# Patient Record
Sex: Female | Born: 1990 | Race: White | Hispanic: No | Marital: Single | Smoking: Former smoker
Health system: Southern US, Community
[De-identification: ages and names within clinical notes are randomized; demographics above are authoritative.]

## PROBLEM LIST (undated history)

## (undated) DIAGNOSIS — A749 Chlamydial infection, unspecified: Secondary | ICD-10-CM

## (undated) DIAGNOSIS — B977 Papillomavirus as the cause of diseases classified elsewhere: Secondary | ICD-10-CM

## (undated) DIAGNOSIS — C801 Malignant (primary) neoplasm, unspecified: Secondary | ICD-10-CM

## (undated) DIAGNOSIS — F419 Anxiety disorder, unspecified: Secondary | ICD-10-CM

## (undated) DIAGNOSIS — Z789 Other specified health status: Secondary | ICD-10-CM

## (undated) DIAGNOSIS — IMO0002 Reserved for concepts with insufficient information to code with codable children: Secondary | ICD-10-CM

## (undated) DIAGNOSIS — R87619 Unspecified abnormal cytological findings in specimens from cervix uteri: Secondary | ICD-10-CM

## (undated) DIAGNOSIS — A549 Gonococcal infection, unspecified: Secondary | ICD-10-CM

## (undated) HISTORY — DX: Papillomavirus as the cause of diseases classified elsewhere: B97.7

## (undated) HISTORY — PX: NO PAST SURGERIES: SHX2092

---
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
## 2011-01-06 ENCOUNTER — Inpatient Hospital Stay (HOSPITAL_COMMUNITY)
Admission: AD | Admit: 2011-01-06 | Discharge: 2011-01-06 | Disposition: A | Discharge status: Home / Self Care | Payer: Self-pay | Source: Ambulatory Visit | Attending: Obstetrics & Gynecology | Admitting: Obstetrics & Gynecology

## 2011-01-06 ENCOUNTER — Inpatient Hospital Stay (HOSPITAL_COMMUNITY): Payer: Self-pay

## 2011-01-06 DIAGNOSIS — R109 Unspecified abdominal pain: Secondary | ICD-10-CM

## 2011-01-06 DIAGNOSIS — O2390 Unspecified genitourinary tract infection in pregnancy, unspecified trimester: Secondary | ICD-10-CM | POA: Insufficient documentation

## 2011-01-06 DIAGNOSIS — N390 Urinary tract infection, site not specified: Secondary | ICD-10-CM | POA: Insufficient documentation

## 2011-01-06 LAB — URINALYSIS, ROUTINE W REFLEX MICROSCOPIC
Bilirubin Urine: NEGATIVE
Glucose, UA: NEGATIVE mg/dL
Hgb urine dipstick: NEGATIVE
Hgb urine dipstick: NEGATIVE
Ketones, ur: NEGATIVE mg/dL
Ketones, ur: NEGATIVE mg/dL
Nitrite: NEGATIVE
Nitrite: NEGATIVE
Protein, ur: NEGATIVE mg/dL
Protein, ur: NEGATIVE mg/dL
Specific Gravity, Urine: 1.015 (ref 1.005–1.030)
Urobilinogen, UA: 0.2 mg/dL (ref 0.0–1.0)
Urobilinogen, UA: 1 mg/dL (ref 0.0–1.0)
pH: 6.5 (ref 5.0–8.0)
pH: 6 (ref 5.0–8.0)

## 2011-01-06 LAB — URINE MICROSCOPIC-ADD ON

## 2011-01-06 LAB — WET PREP, GENITAL
Trich, Wet Prep: NONE SEEN
Yeast Wet Prep HPF POC: NONE SEEN

## 2011-01-06 LAB — DIFFERENTIAL
Basophils Absolute: 0 10*3/uL (ref 0.0–0.1)
Basophils Relative: 0 % (ref 0–1)
Eosinophils Absolute: 0.1 10*3/uL (ref 0.0–0.7)
Monocytes Absolute: 0.6 10*3/uL (ref 0.1–1.0)
Monocytes Relative: 6 % (ref 3–12)

## 2011-01-06 LAB — CBC
MCH: 31 pg (ref 26.0–34.0)
MCHC: 34.5 g/dL (ref 30.0–36.0)
Platelets: 263 10*3/uL (ref 150–400)
RDW: 12.2 % (ref 11.5–15.5)

## 2011-01-06 LAB — POCT PREGNANCY, URINE: Preg Test, Ur: POSITIVE

## 2011-01-07 LAB — URINE CULTURE

## 2011-03-16 IMAGING — US US OB COMP LESS 14 WK
1 series · 14 of 28 positions shown · non-contrast
Comparison: None.

CLINICAL DATA: Abdominal pain.

OBSTETRIC <14 WK US AND TRANSVAGINAL OB US
TECHNIQUE: Both transabdominal and transvaginal ultrasound
examinations were performed for complete evaluation of the
gestation as well as the maternal uterus, adnexal regions, and
pelvic cul-de-sac.  Transvaginal technique was performed to assess
early pregnancy.

[Series 1: us ob comp less 14 wks · 14 of 29 slices shown]
[im 2/29]
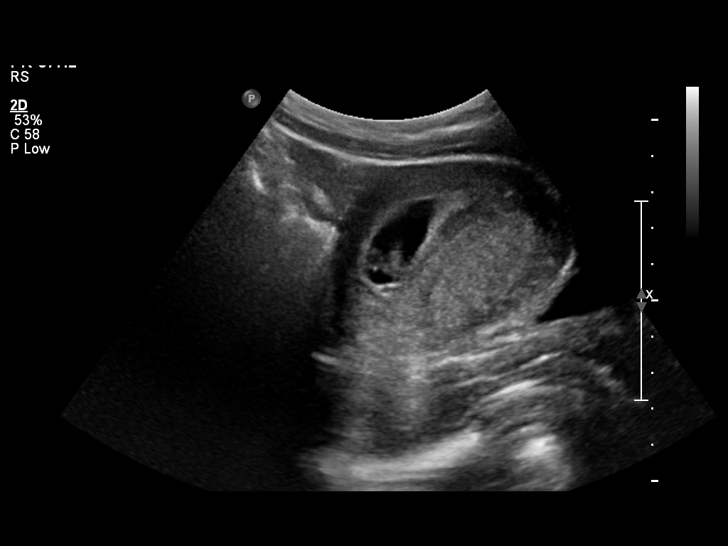
[im 4/29]
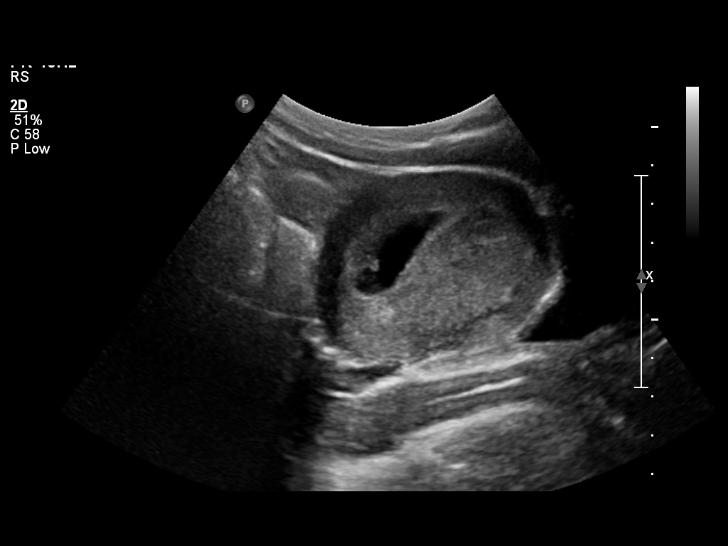
[im 6/29]
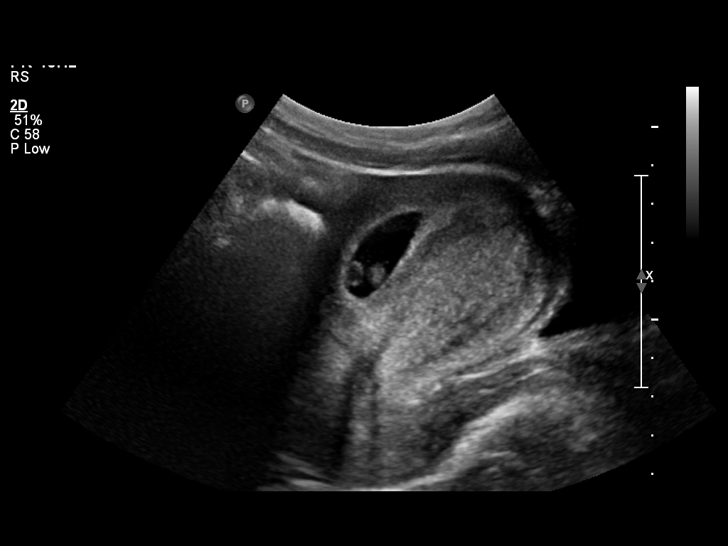
[im 8/29]
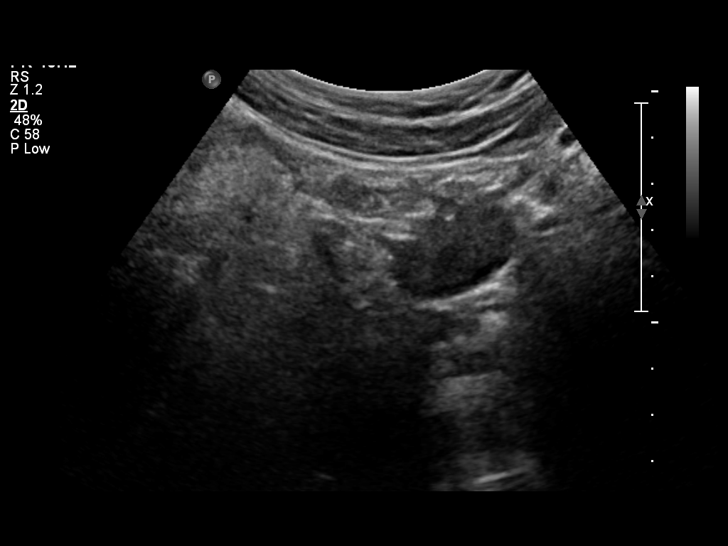
[im 10/29]
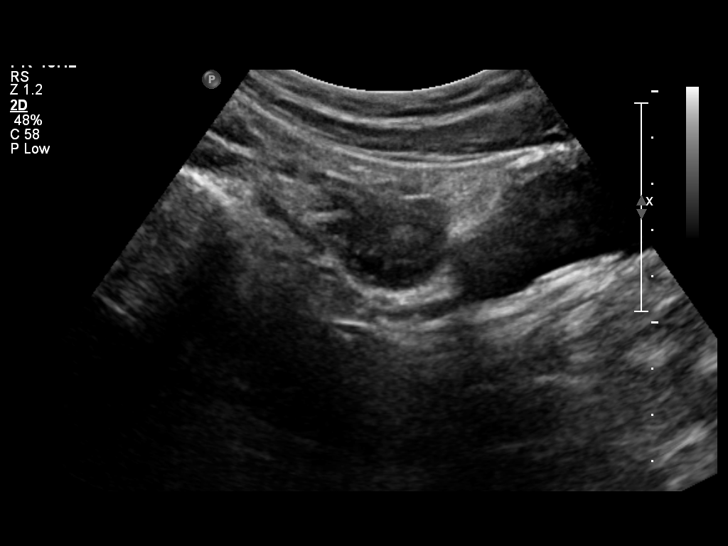
[im 12/29]
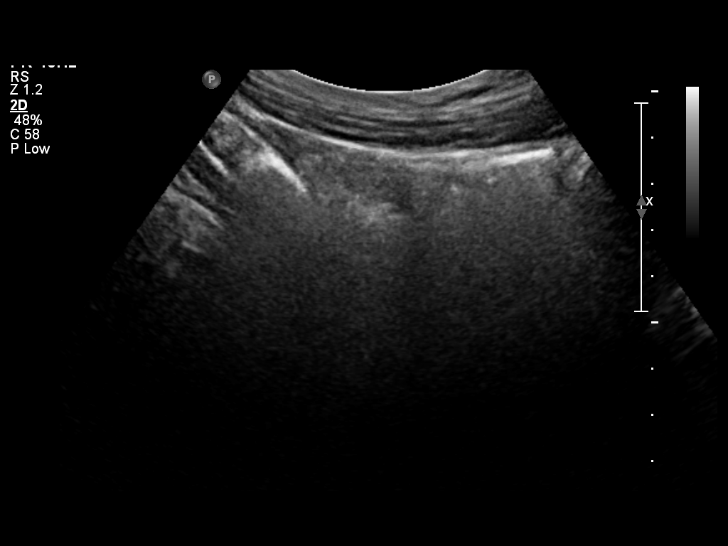
[im 14/29]
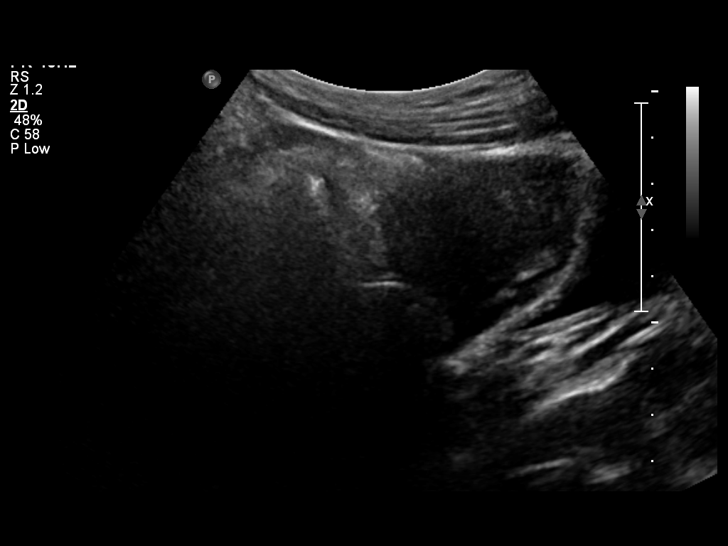
[im 16/29]
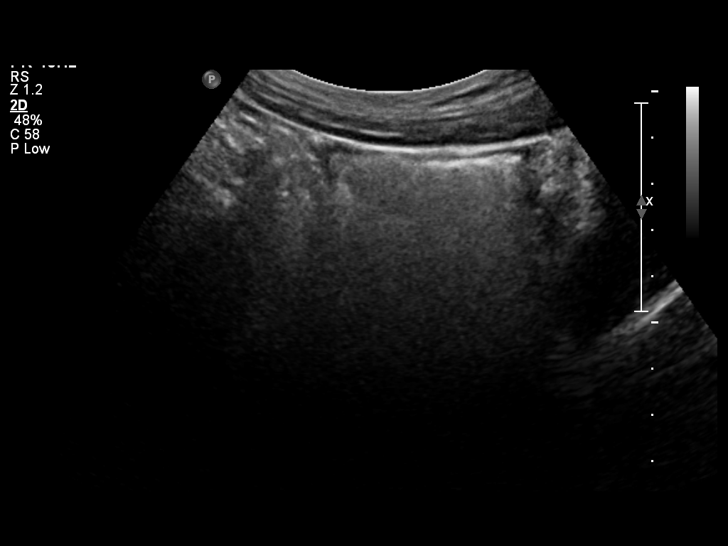
[im 18/29]
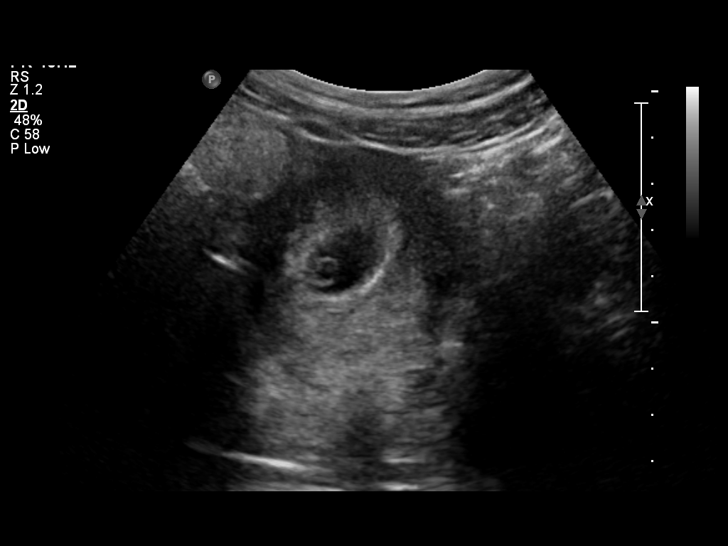
[im 20/29]
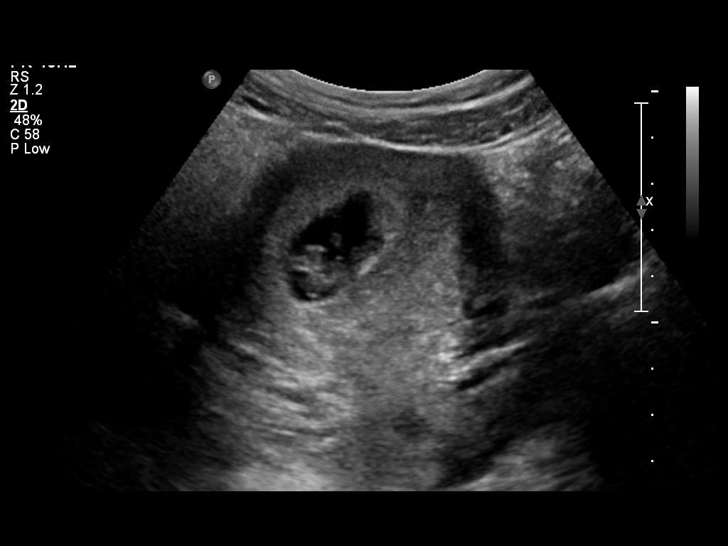
[im 22/29]
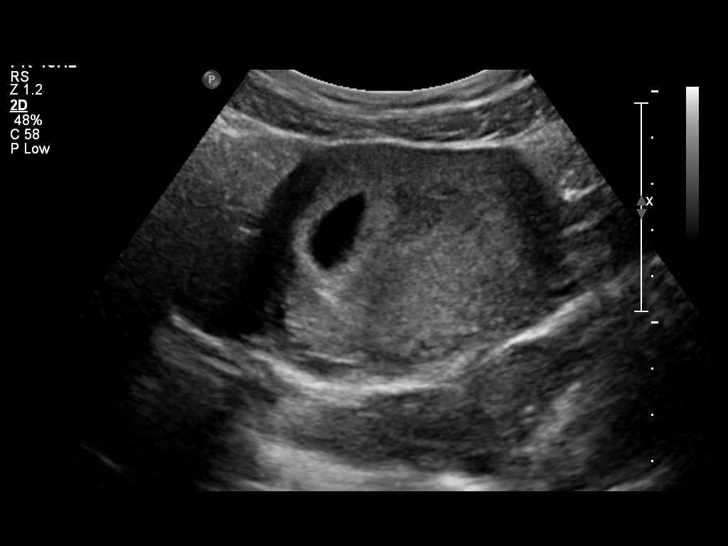
[im 24/29]
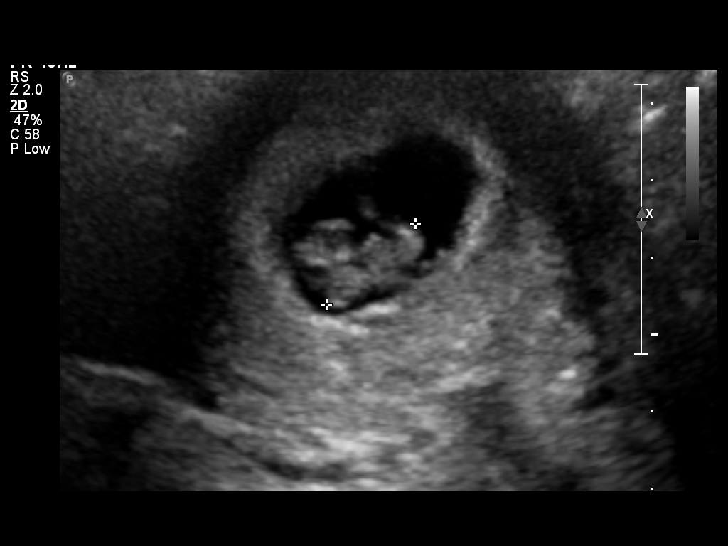
[im 26/29]
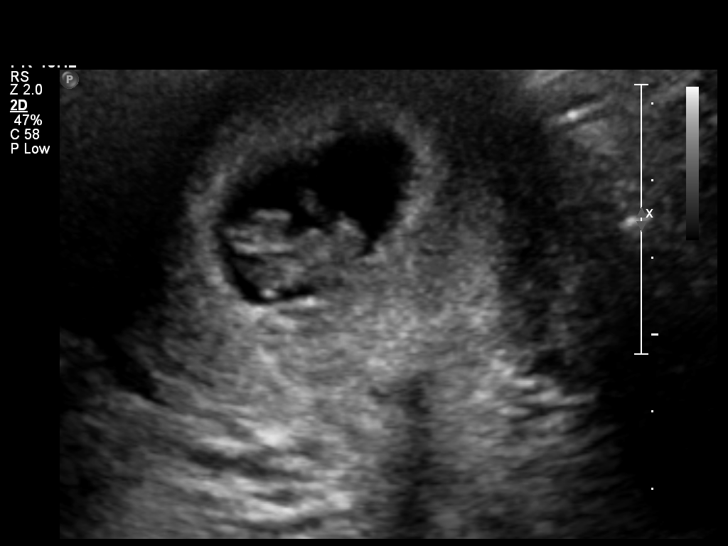
[im 29/29]
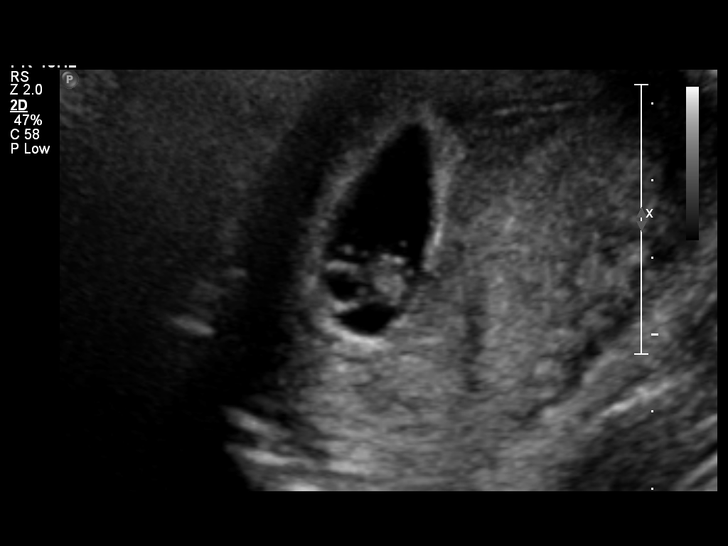

[14 of 28 positions shown; findings below may reference images not displayed]

Intrauterine gestational sac:  Visualized/normal in shape.
Yolk sac: Yes
Embryo: Yes
Cardiac Activity: Yes

CRL: 15.7   mm  8   w  0   d        US EDC: 08/18/2011

Maternal uterus/adnexae:
There is no subacute chorionic hemorrhage.  The left ovary is
normal.  The right ovary is not visible.  No free fluid.
IMPRESSION: Normal appearing single intrauterine pregnancy of 8 weeks 0 days
gestation.

## 2011-03-31 ENCOUNTER — Inpatient Hospital Stay (HOSPITAL_COMMUNITY): Payer: Self-pay

## 2011-03-31 ENCOUNTER — Inpatient Hospital Stay (HOSPITAL_COMMUNITY)
Admission: AD | Admit: 2011-03-31 | Discharge: 2011-03-31 | Disposition: A | Discharge status: Home / Self Care | Payer: Self-pay | Source: Ambulatory Visit | Attending: Obstetrics and Gynecology | Admitting: Obstetrics and Gynecology

## 2011-03-31 DIAGNOSIS — O9989 Other specified diseases and conditions complicating pregnancy, childbirth and the puerperium: Secondary | ICD-10-CM

## 2011-03-31 DIAGNOSIS — R109 Unspecified abdominal pain: Secondary | ICD-10-CM

## 2011-03-31 DIAGNOSIS — O99891 Other specified diseases and conditions complicating pregnancy: Secondary | ICD-10-CM | POA: Insufficient documentation

## 2011-04-11 ENCOUNTER — Inpatient Hospital Stay (HOSPITAL_COMMUNITY): Payer: Self-pay

## 2011-04-11 ENCOUNTER — Inpatient Hospital Stay (HOSPITAL_COMMUNITY)
Admission: AD | Admit: 2011-04-11 | Discharge: 2011-04-11 | Disposition: A | Discharge status: Home / Self Care | Payer: Self-pay | Source: Ambulatory Visit | Attending: Obstetrics and Gynecology | Admitting: Obstetrics and Gynecology

## 2011-04-11 DIAGNOSIS — N390 Urinary tract infection, site not specified: Secondary | ICD-10-CM | POA: Insufficient documentation

## 2011-04-11 DIAGNOSIS — R109 Unspecified abdominal pain: Secondary | ICD-10-CM | POA: Insufficient documentation

## 2011-04-11 DIAGNOSIS — O2390 Unspecified genitourinary tract infection in pregnancy, unspecified trimester: Secondary | ICD-10-CM | POA: Insufficient documentation

## 2011-04-11 LAB — RAPID URINE DRUG SCREEN, HOSP PERFORMED
Barbiturates: NOT DETECTED
Benzodiazepines: NOT DETECTED
Cocaine: NOT DETECTED
Tetrahydrocannabinol: NOT DETECTED

## 2011-04-11 LAB — URINALYSIS, ROUTINE W REFLEX MICROSCOPIC
Bilirubin Urine: NEGATIVE
Ketones, ur: NEGATIVE mg/dL
Nitrite: NEGATIVE
Protein, ur: 100 mg/dL — AB
pH: 7 (ref 5.0–8.0)

## 2011-04-11 LAB — CBC
HCT: 30.9 % — ABNORMAL LOW (ref 36.0–46.0)
Hemoglobin: 10.8 g/dL — ABNORMAL LOW (ref 12.0–15.0)
MCV: 90.4 fL (ref 78.0–100.0)
RDW: 12.5 % (ref 11.5–15.5)
WBC: 9.3 10*3/uL (ref 4.0–10.5)

## 2011-04-11 LAB — WET PREP, GENITAL
Trich, Wet Prep: NONE SEEN
Yeast Wet Prep HPF POC: NONE SEEN

## 2011-04-11 LAB — URINE MICROSCOPIC-ADD ON

## 2011-04-11 LAB — HIV ANTIBODY (ROUTINE TESTING W REFLEX): HIV: NONREACTIVE

## 2011-04-11 LAB — DIFFERENTIAL
Basophils Absolute: 0 10*3/uL (ref 0.0–0.1)
Eosinophils Relative: 1 % (ref 0–5)
Lymphocytes Relative: 22 % (ref 12–46)
Lymphs Abs: 2 10*3/uL (ref 0.7–4.0)
Monocytes Absolute: 0.6 10*3/uL (ref 0.1–1.0)
Neutro Abs: 6.6 10*3/uL (ref 1.7–7.7)

## 2011-04-11 LAB — TYPE AND SCREEN: ABO/RH(D): A POS

## 2011-04-11 LAB — HEPATITIS B SURFACE ANTIGEN: Hepatitis B Surface Ag: NEGATIVE

## 2011-04-12 LAB — URINE CULTURE
Colony Count: 35000
Culture  Setup Time: 201206160431

## 2011-05-06 ENCOUNTER — Ambulatory Visit (INDEPENDENT_AMBULATORY_CARE_PROVIDER_SITE_OTHER): Payer: Self-pay | Admitting: Family Medicine

## 2011-05-06 ENCOUNTER — Other Ambulatory Visit: Payer: Self-pay | Admitting: Obstetrics and Gynecology

## 2011-05-06 DIAGNOSIS — Z3490 Encounter for supervision of normal pregnancy, unspecified, unspecified trimester: Secondary | ICD-10-CM | POA: Insufficient documentation

## 2011-05-06 DIAGNOSIS — A7489 Other chlamydial diseases: Secondary | ICD-10-CM

## 2011-05-06 DIAGNOSIS — A749 Chlamydial infection, unspecified: Secondary | ICD-10-CM

## 2011-05-06 DIAGNOSIS — O98319 Other infections with a predominantly sexual mode of transmission complicating pregnancy, unspecified trimester: Secondary | ICD-10-CM | POA: Insufficient documentation

## 2011-05-06 DIAGNOSIS — Z3480 Encounter for supervision of other normal pregnancy, unspecified trimester: Secondary | ICD-10-CM

## 2011-05-06 LAB — POCT URINALYSIS DIP (DEVICE)
Bilirubin Urine: NEGATIVE
Hgb urine dipstick: NEGATIVE
Ketones, ur: NEGATIVE mg/dL
Protein, ur: NEGATIVE mg/dL
Specific Gravity, Urine: 1.015 (ref 1.005–1.030)
pH: 7 (ref 5.0–8.0)

## 2011-05-06 LAB — CBC
HCT: 31.8 % — ABNORMAL LOW (ref 36.0–46.0)
Hemoglobin: 10.6 g/dL — ABNORMAL LOW (ref 12.0–15.0)
MCH: 30.9 pg (ref 26.0–34.0)
RBC: 3.43 MIL/uL — ABNORMAL LOW (ref 3.87–5.11)

## 2011-05-06 LAB — WET PREP, GENITAL

## 2011-05-06 MED ORDER — AZITHROMYCIN 250 MG PO TABS: ORAL_TABLET | ORAL | Status: DC

## 2011-05-06 NOTE — Progress Notes (Signed)
Addended by: Darrel Hoover on: 05/06/2011 03:31 PM   Modules accepted: Orders
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
## 2011-05-06 NOTE — Progress Notes (Signed)
Subjective:    Brandy Kidd is a 20 y.o. female being seen today for her obstetrical visit. She is at approximately 25.[redacted] wks EGA by 20-wk [place]. Patient reports no bleeding, no leaking and cramps, pain on RT side and lower back. Fetal movement: decreased.  Menstrual History: OB History    Grav Para Term Preterm Abortions TAB SAB Ect Mult Living   3    2  2    0      Menarche age: Age 86 No LMP recorded. Patient is pregnant.      Review of Systems Pertinent items are noted in HPI.  Objective:    BP 114/71  Temp 97.6 F (36.4 C)  Ht 5\' 6"  (1.676 m)  Wt 125 lb 4.8 oz (56.836 kg)  BMI 20.22 kg/m2 FHT: 154 BPM  Uterine Size: 26.5 cm    General: Flat affect, disheveled Heart: RRR, no murmur Lungs: CTA b/l Abd: Soft, nontender, no CVA tenderness Ext: no edema External Genitalia: NML in appearance, no noted external vaginal d/c, but noted internally; strawberry cervix; milky white discharge with foul odor; bimanual with no CMT, fundus appropriate for dates; GC/Ch sample and wet prep taken Assessment:    Pregnancy 25 and 1/7 weeks   Plan:    OBGCT: ordered. Antibody screen: Antibody screen ordered. Follow up in 4 weeks.   Discussed with pt that [place] is not indicated today. She had 20-wk [place] and this was NML. No bleeding or abdominal tenderness on exam and explained to pt that her cramping likely due to known chlamydia infection which we will tx today with 1 g azithromycin. Precautions discussed, pt to f/u in 4 wks or prn. Pt to have labs including glucola drawn today. She has applied for medicaid. States her boyfriend has moved out and she is staying with his sister; declines SW today. 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 However, she is <21 yrs old and we will not rescreen today. She should begin routine pap screening at age 79 per current guidelines.

## 2011-05-06 NOTE — Patient Instructions (Signed)
Chlamydia, Females and Males Chlamydia is an infection that can be found in the vagina, urethra, cervix, rectum and pelvic organs in the female. In the female, it most often causes urethritis. This happens when it infects the tube (urethra) that carries the urine out of the bladder. When Chlamydia causes urethritis, there may be burning with urination. In males, it may also infect the tubes that carry the sperm from the testicle. This causes pain in the testicles and infect the prostate gland. In females, an infection of the pelvic organs is also called PID (pelvic inflammatory disease). PID may be a cause of sudden (acute) lower abdominal/belly (pelvic) pain and fever. But with Chlamydia, the infection sometimes does not cause problems that you notice (asymptomatic). It may cause an abnormal or watery mucous-like discharge from the birth canal (vagina) or penis.  CAUSES Chlamydia is caused by germs (bacteria) that are spread during sexual contact of the:  Genitals.   Mouth.   Rectum.  This infection may also be passed to a newborn baby coming through the infected birth canal. This causes eye and lung infections in the baby. Chlamydia often goes unnoticed. So it is easy to transmit it to a sexual partner without even knowing. SYMPTOMS In females, symptoms may go unnoticed. Symptoms that are more noticeable can include:  Belly (abdominal) pain.  Painful intercourse.   Watery mucous-like discharge from the vagina.  Miscarriage.   Discomfort when urinating.  Inflammation of the rectum.   In males they include:  Burning with urination.   Pain in the testicles.   Watery mucous-like discharge from the penis.  It can cause longstanding (chronic) pelvic pain after frequent infections. TREATMENT  Chlamydia can be treated with medications which kill germs (antibiotics).   Inform all sexual partners about the infection. All sexual contacts need to be treated.   If you are pregnant, do not  take tetracycline type antibiotics.   PID can cause women to not be able to have children (sterile) if left untreated or if half-treated. It does this by scarring the tubes to the uterus (fallopian tubes). They carry the egg needed to form a baby. It is important to finish ALL medications given to you.   Sterility or future tubal (ectopic) pregnancies can occur in fully treated individuals. It is important to follow your prescribed treatment. That will lessen the chances of these problems.   This is a sexually transmitted infection. So you are also at risk for other sexually transmitted diseases. These include: Gonorrhea and HIV (AIDS). Testing may be done for the other sexually transmitted diseases if one disease is detected.   It is important to treat chlamydia as soon as possible. It can cause damage to other organs.  HOME CARE INSTRUCTIONS  Finish all medication as prescribed. Incomplete treatment will put you at risk for not being able to have children (sterility) and tubal pregnancy. If one sexually transmitted disease is discovered, often treatment will be started to cover other possible infections.   Only take over-the-counter or prescription medicines for pain, discomfort, or fever as directed by your caregiver.   Rest.   Eat a balanced diet and drink plenty of fluids.   Warning: This infection is contagious. Do not have sex until treatment is completed. Follow up at your caregiver's office or the clinic to which you were referred. If your diagnosis (learning what is wrong) is confirmed by culture or some other method, your recent sexual contacts need treatment. Even if they are symptom  free or have a negative culture or evaluation, they should be treated.   For the protection of your privacy, test results can not be given over the phone. Make sure you receive the results of your test. Ask how these results are to be obtained if you have not been informed. It is your responsibility to  obtain your test results.  PREVENTION  Women should use sanitary pads instead of tampons for vaginal discharge.   Wipe front to back after using the toilet and avoid douching.   Test for chlamydia if you are having an IUD inserted.   Practice safe sex, use condoms, have only one sex partner and be sure your sex partner is not having sex with others.   Ask your caregiver to test you for chlamydia at your regular checkups or sooner if you are having symptoms.   Ask for further information if you are pregnant.  SEEK IMMEDIATE MEDICAL CARE IF:  You develop an oral temperature above 100.4 F, not controlled by medications or lasting more than 2 days.   You develop an increase in pain.   You develop any type of abnormal discharge.   You develop vaginal bleeding and it is not time for your period.   You develop painful intercourse.  MAKE SURE YOU:   Understand these instructions.   Will watch your condition.   Will get help right away if you are not doing well or get worse.  Document Released: 10/12/2005 Document Re-Released: 01/06/2010 [facility] Patient Information 2011 [place], [place].

## 2011-05-07 LAB — RPR

## 2011-05-07 LAB — GC/CHLAMYDIA PROBE AMP, GENITAL: GC Probe Amp, Genital: NEGATIVE

## 2011-05-07 NOTE — Progress Notes (Signed)
Addended by: Darrel Hoover on: 05/07/2011 02:03 PM   Modules accepted: Orders

## 2011-05-08 LAB — CULTURE, OB URINE: Colony Count: 50000

## 2011-06-03 ENCOUNTER — Ambulatory Visit (INDEPENDENT_AMBULATORY_CARE_PROVIDER_SITE_OTHER): Payer: Self-pay | Admitting: Advanced Practice Midwife

## 2011-06-03 DIAGNOSIS — A749 Chlamydial infection, unspecified: Secondary | ICD-10-CM

## 2011-06-03 DIAGNOSIS — Z331 Pregnant state, incidental: Secondary | ICD-10-CM

## 2011-06-03 DIAGNOSIS — A7489 Other chlamydial diseases: Secondary | ICD-10-CM

## 2011-06-03 LAB — POCT URINALYSIS DIP (DEVICE)
Glucose, UA: NEGATIVE mg/dL
Protein, ur: NEGATIVE mg/dL
Specific Gravity, Urine: 1.01 (ref 1.005–1.030)

## 2011-06-03 NOTE — Progress Notes (Signed)
P 115, c/o whitish normal vaginal discharge

## 2011-06-03 NOTE — Progress Notes (Signed)
Well. Denies problems today, no h/a, abd pain, vision changes. 1 hour GCT wnl.

## 2011-06-04 ENCOUNTER — Telehealth (HOSPITAL_COMMUNITY): Payer: Self-pay

## 2011-06-09 ENCOUNTER — Inpatient Hospital Stay (HOSPITAL_COMMUNITY): Payer: Medicaid Other

## 2011-06-09 ENCOUNTER — Encounter (HOSPITAL_COMMUNITY): Payer: Self-pay | Admitting: *Deleted

## 2011-06-09 ENCOUNTER — Inpatient Hospital Stay (HOSPITAL_COMMUNITY)
Admission: AD | Admit: 2011-06-09 | Discharge: 2011-06-09 | Disposition: A | Discharge status: Home / Self Care | Payer: Medicaid Other | Source: Ambulatory Visit | Attending: Obstetrics & Gynecology | Admitting: Obstetrics & Gynecology

## 2011-06-09 DIAGNOSIS — A749 Chlamydial infection, unspecified: Secondary | ICD-10-CM

## 2011-06-09 DIAGNOSIS — O368190 Decreased fetal movements, unspecified trimester, not applicable or unspecified: Secondary | ICD-10-CM | POA: Insufficient documentation

## 2011-06-09 DIAGNOSIS — B9689 Other specified bacterial agents as the cause of diseases classified elsewhere: Secondary | ICD-10-CM

## 2011-06-09 DIAGNOSIS — O2340 Unspecified infection of urinary tract in pregnancy, unspecified trimester: Secondary | ICD-10-CM

## 2011-06-09 HISTORY — DX: Other specified health status: Z78.9

## 2011-06-09 LAB — URINALYSIS, ROUTINE W REFLEX MICROSCOPIC
Bilirubin Urine: NEGATIVE
Nitrite: POSITIVE — AB
Specific Gravity, Urine: 1.01 (ref 1.005–1.030)
Urobilinogen, UA: 0.2 mg/dL (ref 0.0–1.0)
pH: 7 (ref 5.0–8.0)

## 2011-06-09 LAB — STREP B DNA PROBE: GBS: POSITIVE

## 2011-06-09 LAB — URINE MICROSCOPIC-ADD ON

## 2011-06-09 LAB — WET PREP, GENITAL

## 2011-06-09 MED ORDER — CEPHALEXIN 500 MG PO CAPS: 500.0000 mg | ORAL_CAPSULE | Freq: Two times a day (BID) | ORAL | Status: DC

## 2011-06-09 MED ORDER — CEPHALEXIN 500 MG PO CAPS: 500.0000 mg | ORAL_CAPSULE | Freq: Two times a day (BID) | ORAL | Status: AC

## 2011-06-09 MED ORDER — METRONIDAZOLE 500 MG PO TABS: 500.0000 mg | ORAL_TABLET | Freq: Two times a day (BID) | ORAL | Status: DC

## 2011-06-09 MED ORDER — METRONIDAZOLE 500 MG PO TABS: 500.0000 mg | ORAL_TABLET | Freq: Two times a day (BID) | ORAL | Status: AC

## 2011-06-09 MED ORDER — AZITHROMYCIN 1 G PO PACK
1.0000 g | PACK | Freq: Once | ORAL | Status: AC
  Administered 2011-06-09: 1 g via ORAL
  Filled 2011-06-09: qty 1

## 2011-06-09 NOTE — Progress Notes (Signed)
Pt states, "  The baby hasn't been moving the same since Sunday, and I haven't felt any movement since last night."

## 2011-06-09 NOTE — ED Notes (Signed)
Room 4 ready, triage tobe completed  in room by RN

## 2011-06-09 NOTE — ED Provider Notes (Addendum)
Chief Complaint:  Decreased Fetal Movement   Brandy Kidd is  20 y.o. G3P0020 at [redacted]w[redacted]d presents complaining of Decreased Fetal Movement . Pt states she has been having decreased fetal movement starting on Sunday night with no fetal movement since last night.  Denies any contractions,  She did have some spotting on Sun that self resolved.  Denies any LOF/ROM.  Denies any trauma to the abdomen. Last intercourse one week ago but is very inconsistent in her statements.  But not within the last 24 hours. No vag d/c itching or odor.    Obstetrical/Gynecological History: OB History    Grav Para Term Preterm Abortions TAB SAB Ect Mult Living   3    2  2    0      Past Medical History: Past Medical History  Diagnosis Date  . No pertinent past medical history     Past Surgical History: Past Surgical History  Procedure Date  . No past surgeries     Family History: Family History  Problem Relation Age of Onset  . Alcohol abuse Mother   . Depression Mother   . Drug abuse Mother   . Heart disease Mother     mother had a closed valve and had heart surgery  . Alcohol abuse Father   . Drug abuse Father     Social History: History  Substance Use Topics  . Smoking status: Former Smoker -- 0.0 packs/day    Types: Cigarettes  . Smokeless tobacco: Not on file  . Alcohol Use: No    Allergies: No Known Allergies  Prescriptions prior to admission  Medication Sig Dispense Refill  . azithromycin (ZITHROMAX) 250 MG tablet Take all 4 tablets by mouth at one time  4 each  0  . Prenatal Vit-Fe Psac Cmplx-FA (PRENATAL MULTIVITAMIN) 60-1 MG tablet Take 1 tablet by mouth daily.          Review of Systems - Negative except per HPI  Physical Exam   Blood pressure 120/68, pulse 98, temperature 98.3 F (36.8 C), resp. rate 18, height 5\' 5"  (1.651 m), weight 58.333 kg (128 lb 9.6 oz), SpO2 99.00%.  General: General appearance - alert, well appearing, and in no distress Chest - clear to  auscultation, no wheezes, rales or rhonchi, symmetric air entry Heart - normal rate, regular rhythm, normal S1, S2, no murmurs, rubs, clicks or gallops Abdomen - soft, nontender, nondistended, no masses or organomegaly Gravid, size cwd, vertex on leopolds Pelvic - normal external genitalia, vulva, vagina, cervix, uterus and adnexa, cervix visually closed, thick, scant discharge, no bleeding Extremities - peripheral pulses normal, no pedal edema, no clubbing or cyanosis Baseline: 150s bpm, Variability: Good {> 6 bpm), Accelerations: 10x10 and Decelerations: Absent irregular, every 3-7 minutes   Labs: No results found for this or any previous visit (from the past 24 hour(s)). Imaging Studies:  No results found.   Assessment: Brandy Kidd is  20 y.o. G3P0020 at [redacted]w[redacted]d presents with decreased fetal movement.  Plan: 1. Check BPP and placenta due to h/o bleeding 2. Check FFN due to contractions 3. Wet prep and cultures sent.  Pt states she did not take abx as written due to financial means.  Will treat empirically.  4. Awaiting results.  CAMPBELL,LACHELLE8/14/20123:49 PM   Results reviewed - GBS +, Chlamydia +. Pt was treated for chlamydia during MAU visit. Results forwarded to clinic to be filed in chart. Pt has follow up scheduled on 8/22.

## 2011-06-09 NOTE — ED Provider Notes (Signed)
Results reviewed.Marland KitchenBPP 8/8. Wet prep + BV, UA suggestive of UTI.  Will treat with keflex. Urine culture sent.  Will d/c home with close clinic follow up.

## 2011-06-09 NOTE — Progress Notes (Signed)
Pt in c/o decreased movement since Sunday, no movement today.  Denies any bleeding or leaking of fluid presently.  Reports one episode of spotting on Sunday.

## 2011-06-11 LAB — URINE CULTURE: Colony Count: >=100000

## 2011-06-11 LAB — GC/CHLAMYDIA PROBE AMP, GENITAL: Chlamydia, DNA Probe: POSITIVE — AB

## 2011-06-14 LAB — CULTURE, BETA STREP (GROUP B ONLY)

## 2011-06-15 NOTE — Progress Notes (Signed)
Encounter addended by: Georges Mouse, CNM on: 06/15/2011  8:28 AM<BR>     Documentation filed: Charting, Inpatient Notes

## 2011-06-16 ENCOUNTER — Inpatient Hospital Stay (HOSPITAL_COMMUNITY): Payer: Medicaid Other

## 2011-06-16 ENCOUNTER — Emergency Department (HOSPITAL_COMMUNITY)
Admission: EM | Admit: 2011-06-16 | Discharge: 2011-06-16 | Disposition: A | Discharge status: Other Acute Inpatient Hospital | Payer: Medicaid Other | Attending: Emergency Medicine | Admitting: Emergency Medicine

## 2011-06-16 ENCOUNTER — Telehealth (HOSPITAL_COMMUNITY): Payer: Self-pay

## 2011-06-16 ENCOUNTER — Encounter (HOSPITAL_COMMUNITY): Payer: Self-pay | Admitting: *Deleted

## 2011-06-16 ENCOUNTER — Inpatient Hospital Stay (HOSPITAL_COMMUNITY)
Admission: AD | Admit: 2011-06-16 | Discharge: 2011-06-16 | Discharge status: Against Medical Advice | Payer: Medicaid Other | Source: Ambulatory Visit | Attending: Family Medicine | Admitting: Family Medicine

## 2011-06-16 DIAGNOSIS — O99891 Other specified diseases and conditions complicating pregnancy: Secondary | ICD-10-CM | POA: Insufficient documentation

## 2011-06-16 DIAGNOSIS — Y92009 Unspecified place in unspecified non-institutional (private) residence as the place of occurrence of the external cause: Secondary | ICD-10-CM | POA: Insufficient documentation

## 2011-06-16 DIAGNOSIS — W010XXA Fall on same level from slipping, tripping and stumbling without subsequent striking against object, initial encounter: Secondary | ICD-10-CM | POA: Insufficient documentation

## 2011-06-16 DIAGNOSIS — IMO0002 Reserved for concepts with insufficient information to code with codable children: Secondary | ICD-10-CM | POA: Insufficient documentation

## 2011-06-16 DIAGNOSIS — O368190 Decreased fetal movements, unspecified trimester, not applicable or unspecified: Secondary | ICD-10-CM | POA: Insufficient documentation

## 2011-06-16 DIAGNOSIS — A749 Chlamydial infection, unspecified: Secondary | ICD-10-CM

## 2011-06-16 LAB — URINALYSIS, ROUTINE W REFLEX MICROSCOPIC
Bilirubin Urine: NEGATIVE
Nitrite: NEGATIVE
Specific Gravity, Urine: 1.013 (ref 1.005–1.030)
Urobilinogen, UA: 0.2 mg/dL (ref 0.0–1.0)
pH: 7.5 (ref 5.0–8.0)

## 2011-06-16 LAB — RAPID URINE DRUG SCREEN, HOSP PERFORMED
Barbiturates: NOT DETECTED
Benzodiazepines: NOT DETECTED

## 2011-06-16 LAB — URINE MICROSCOPIC-ADD ON

## 2011-06-16 NOTE — ED Provider Notes (Signed)
Agree with above note.  Mallorie Norrod H. 06/16/2011 6:34 AM

## 2011-06-16 NOTE — Telephone Encounter (Signed)
Called pt and left message to return our call to clinics.  Pt does have an appt on Wednesday, 06/17/11 @ 1045am.
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
## 2011-06-16 NOTE — ED Notes (Signed)
Pt dressed started to walk off unit. Asked where she was going stated she was getting something from outside and would be back. MD aware and present.
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
## 2011-06-16 NOTE — ED Notes (Signed)
Resident went outside to look for pt. Pt not outside. Nowhere to be found.

## 2011-06-16 NOTE — Telephone Encounter (Signed)
Message copied by Faythe Casa on Tue Jun 16, 2011 10:43 AM ------      Message from: Faythe Casa      Created: Tue Jun 16, 2011 10:37 AM      Regarding: RE: Lab results       Sent info to admin pool to schedule appt.       ----- Message -----         From: Georges Mouse, CNM         Sent: 06/15/2011   8:23 AM           To: [facility] Clinical Pool      Subject: Lab results                                              Please note in chart GBS + on 06/09/11            Also, positive for chlamydia on 06/09/11. Pt was treated during MAU visit on that day. Needs test of cure.             Thanks,      Dorene Grebe

## 2011-06-17 ENCOUNTER — Ambulatory Visit (INDEPENDENT_AMBULATORY_CARE_PROVIDER_SITE_OTHER): Payer: Self-pay | Admitting: Obstetrics & Gynecology

## 2011-06-17 ENCOUNTER — Encounter: Payer: Self-pay | Admitting: Obstetrics & Gynecology

## 2011-06-17 DIAGNOSIS — O98319 Other infections with a predominantly sexual mode of transmission complicating pregnancy, unspecified trimester: Secondary | ICD-10-CM

## 2011-06-17 DIAGNOSIS — A749 Chlamydial infection, unspecified: Secondary | ICD-10-CM

## 2011-06-17 DIAGNOSIS — O26839 Pregnancy related renal disease, unspecified trimester: Secondary | ICD-10-CM

## 2011-06-17 LAB — POCT URINALYSIS DIP (DEVICE)
Bilirubin Urine: NEGATIVE
Glucose, UA: NEGATIVE mg/dL
Hgb urine dipstick: NEGATIVE
Nitrite: NEGATIVE
Specific Gravity, Urine: 1.015 (ref 1.005–1.030)
Urobilinogen, UA: 0.2 mg/dL (ref 0.0–1.0)

## 2011-06-17 NOTE — Progress Notes (Signed)
No complaints, good FM. Was seen yest d/t fall and was monitored. Scraped her chin on the floor, no swelling or deformity noted

## 2011-06-28 ENCOUNTER — Inpatient Hospital Stay (HOSPITAL_COMMUNITY)
Admission: AD | Admit: 2011-06-28 | Discharge: 2011-06-28 | Disposition: A | Discharge status: Home / Self Care | Payer: Self-pay | Source: Ambulatory Visit | Attending: Obstetrics and Gynecology | Admitting: Obstetrics and Gynecology

## 2011-06-28 ENCOUNTER — Encounter (HOSPITAL_COMMUNITY): Payer: Self-pay | Admitting: *Deleted

## 2011-06-28 DIAGNOSIS — O4700 False labor before 37 completed weeks of gestation, unspecified trimester: Secondary | ICD-10-CM | POA: Insufficient documentation

## 2011-06-28 DIAGNOSIS — Z3480 Encounter for supervision of other normal pregnancy, unspecified trimester: Secondary | ICD-10-CM

## 2011-06-28 DIAGNOSIS — A749 Chlamydial infection, unspecified: Secondary | ICD-10-CM

## 2011-06-28 LAB — URINALYSIS, ROUTINE W REFLEX MICROSCOPIC
Glucose, UA: NEGATIVE mg/dL
Protein, ur: NEGATIVE mg/dL
Specific Gravity, Urine: 1.015 (ref 1.005–1.030)
Urobilinogen, UA: 0.2 mg/dL (ref 0.0–1.0)

## 2011-06-28 LAB — URINE MICROSCOPIC-ADD ON

## 2011-06-28 NOTE — Progress Notes (Cosign Needed)
Pt G7 P0 at 32.5wks, having contractions x .  Pt denies bleeding or discharge.

## 2011-06-28 NOTE — ED Provider Notes (Signed)
History   States sex this am and started having contractions 20 min ago.  Chief Complaint  Patient presents with  . Contractions   HPI  OB History    Grav Para Term Preterm Abortions TAB SAB Ect Mult Living   3    2  2    0      Past Medical History  Diagnosis Date  . No pertinent past medical history     Past Surgical History  Procedure Date  . No past surgeries     Family History  Problem Relation Age of Onset  . Alcohol abuse Mother   . Depression Mother   . Drug abuse Mother   . Heart disease Mother     mother had a closed valve and had heart surgery  . Alcohol abuse Father   . Drug abuse Father     History  Substance Use Topics  . Smoking status: Former Smoker -- 0.0 packs/day    Types: Cigarettes  . Smokeless tobacco: Not on file  . Alcohol Use: No    Allergies: No Known Allergies  Prescriptions prior to admission  Medication Sig Dispense Refill  . Prenatal Vit-Fe Psac Cmplx-FA (PRENATAL MULTIVITAMIN) 60-1 MG tablet Take 1 tablet by mouth daily.       . prenatal vitamin w/FE, FA (PRENATAL 1 + 1) 27-1 MG TABS Take 1 tablet by mouth daily.          Review of Systems  Constitutional: Negative.   HENT: Negative.   Eyes: Negative.   Respiratory: Negative.   Cardiovascular: Negative.   Gastrointestinal: Negative.   Genitourinary: Negative.   Musculoskeletal: Negative.   Skin: Negative.   Neurological: Negative.   Endo/Heme/Allergies: Negative.   Psychiatric/Behavioral: Negative.   All other systems reviewed and are negative.   Physical Exam   Blood pressure 119/69, pulse 93, temperature 98.6 F (37 C), temperature source Oral, resp. rate 18, height 5\' 5"  (1.651 m), weight 61.326 kg (135 lb 3.2 oz).  Physical Exam  Constitutional: She is oriented to person, place, and time. She appears well-developed and well-nourished.  HENT:  Head: Normocephalic.  Eyes: Pupils are equal, round, and reactive to light.  Neck: Normal range of motion.    Cardiovascular: Normal rate, regular rhythm, normal heart sounds and intact distal pulses.   Respiratory: Effort normal and breath sounds normal.  GI: Soft. Bowel sounds are normal.  Genitourinary: Vagina normal and uterus normal.  Musculoskeletal: Normal range of motion.  Neurological: She is alert and oriented to person, place, and time. She has normal reflexes.  Skin: Skin is warm and dry.  Psychiatric: She has a normal mood and affect. Her behavior is normal. Judgment and thought content normal.    MAU Course  Procedures  MDM SVE ft/th/post/high. No contractions at present.  Assessment and Plan  [place] hicks. Stable maternal-fetal unit.  Zerita Boers 06/28/2011, 7:48 PM

## 2011-06-28 NOTE — Progress Notes (Signed)
Cramps in low abd started 20 ago

## 2011-07-06 ENCOUNTER — Inpatient Hospital Stay (HOSPITAL_COMMUNITY)
Admission: AD | Admit: 2011-07-06 | Discharge: 2011-07-06 | Disposition: A | Discharge status: Home / Self Care | Payer: Medicaid Other | Source: Ambulatory Visit | Attending: Obstetrics & Gynecology | Admitting: Obstetrics & Gynecology

## 2011-07-06 DIAGNOSIS — O4700 False labor before 37 completed weeks of gestation, unspecified trimester: Secondary | ICD-10-CM | POA: Insufficient documentation

## 2011-07-06 DIAGNOSIS — Z3480 Encounter for supervision of other normal pregnancy, unspecified trimester: Secondary | ICD-10-CM

## 2011-07-06 LAB — URINALYSIS, ROUTINE W REFLEX MICROSCOPIC
Bilirubin Urine: NEGATIVE
Glucose, UA: NEGATIVE mg/dL
Ketones, ur: NEGATIVE mg/dL
Protein, ur: 30 mg/dL — AB
pH: 7 (ref 5.0–8.0)

## 2011-07-06 LAB — URINE MICROSCOPIC-ADD ON

## 2011-07-06 MED ORDER — NIFEDIPINE 10 MG PO CAPS
10.0000 mg | ORAL_CAPSULE | Freq: Once | ORAL | Status: AC
  Administered 2011-07-06: 10 mg via ORAL
  Filled 2011-07-06: qty 1

## 2011-07-06 MED ORDER — TERBUTALINE SULFATE 1 MG/ML IJ SOLN
0.2500 mg | Freq: Once | INTRAMUSCULAR | Status: DC
  Filled 2011-07-06: qty 1

## 2011-07-06 NOTE — ED Provider Notes (Signed)
History   In with c/o contractions. States had sex this am and this afternoon started having uc's.  Chief Complaint  Patient presents with  . Abdominal Pain   HPI  OB History    Grav Para Term Preterm Abortions TAB SAB Ect Mult Living   3    2  2    0      Past Medical History  Diagnosis Date  . No pertinent past medical history     Past Surgical History  Procedure Date  . No past surgeries     Family History  Problem Relation Age of Onset  . Alcohol abuse Mother   . Depression Mother   . Drug abuse Mother   . Heart disease Mother     mother had a closed valve and had heart surgery  . Alcohol abuse Father   . Drug abuse Father     History  Substance Use Topics  . Smoking status: Former Smoker -- 0.0 packs/day    Types: Cigarettes  . Smokeless tobacco: Not on file  . Alcohol Use: No    Allergies: No Known Allergies  Prescriptions prior to admission  Medication Sig Dispense Refill  . prenatal vitamin w/FE, FA (PRENATAL 1 + 1) 27-1 MG TABS Take 1 tablet by mouth daily.        . Prenatal Vit-Fe Psac Cmplx-FA (PRENATAL MULTIVITAMIN) 60-1 MG tablet Take 1 tablet by mouth daily.         Review of Systems  Constitutional: Negative.   HENT: Negative.   Eyes: Negative.   Respiratory: Negative.   Cardiovascular: Negative.   Gastrointestinal: Negative.   Genitourinary: Negative.   Skin: Negative.   Neurological: Negative.   Endo/Heme/Allergies: Negative.   Psychiatric/Behavioral: Negative.    Physical Exam   Blood pressure 127/77, pulse 81, temperature 98 F (36.7 C), temperature source Oral, resp. rate 18, height 5\' 5"  (1.651 m), weight 60.147 kg (132 lb 9.6 oz).  Physical Exam  Constitutional: She is oriented to person, place, and time. She appears well-developed and well-nourished.  HENT:  Head: Normocephalic.  Neck: Normal range of motion.  Cardiovascular: Normal rate, regular rhythm, normal heart sounds and intact distal pulses.   Respiratory:  Effort normal and breath sounds normal.  GI: Soft. Bowel sounds are normal.  Genitourinary: Vagina normal and uterus normal.  Musculoskeletal: Normal range of motion.  Neurological: She is alert and oriented to person, place, and time. She has normal reflexes.  Skin: Skin is warm and dry.  Psychiatric: She has a normal mood and affect. Her behavior is normal. Judgment and thought content normal.    MAU Course  Procedures  MDM brackston hicks  Assessment and Plan  terb SQ, procardia 10 po. If uc's abate d/c home.  Zerita Boers 07/06/2011, 9:40 PM

## 2011-07-06 NOTE — Progress Notes (Signed)
Sharp lower abd pain started this morning.  At first was coming and going, now more constant.

## 2011-07-06 NOTE — ED Provider Notes (Signed)
Attestation of Attending Supervision of Advanced Practitioner: Evaluation and management procedures were performed by the PA/NP/CNM/OB Fellow under my supervision/collaboration. Chart reviewed and agree with management and plan.  Salia Cangemi A 07/06/2011 9:54 PM

## 2011-07-08 ENCOUNTER — Other Ambulatory Visit: Payer: Self-pay | Admitting: Obstetrics and Gynecology

## 2011-07-08 ENCOUNTER — Ambulatory Visit (INDEPENDENT_AMBULATORY_CARE_PROVIDER_SITE_OTHER): Payer: Medicaid Other | Admitting: Physician Assistant

## 2011-07-08 VITALS — BP 120/80 | Temp 97.1°F | Wt 132.5 lb

## 2011-07-08 DIAGNOSIS — A7489 Other chlamydial diseases: Secondary | ICD-10-CM

## 2011-07-08 DIAGNOSIS — R809 Proteinuria, unspecified: Secondary | ICD-10-CM

## 2011-07-08 DIAGNOSIS — A749 Chlamydial infection, unspecified: Secondary | ICD-10-CM

## 2011-07-08 DIAGNOSIS — Z3490 Encounter for supervision of normal pregnancy, unspecified, unspecified trimester: Secondary | ICD-10-CM

## 2011-07-08 DIAGNOSIS — Z3480 Encounter for supervision of other normal pregnancy, unspecified trimester: Secondary | ICD-10-CM

## 2011-07-08 LAB — POCT URINALYSIS DIP (DEVICE)
Bilirubin Urine: NEGATIVE
Glucose, UA: NEGATIVE mg/dL
Ketones, ur: NEGATIVE mg/dL

## 2011-07-08 MED ORDER — AZITHROMYCIN 500 MG PO TABS: 1000.0000 mg | ORAL_TABLET | Freq: Every day | ORAL | Status: DC

## 2011-07-08 MED ORDER — AZITHROMYCIN 500 MG PO TABS: 1000.0000 mg | ORAL_TABLET | Freq: Every day | ORAL | Status: AC

## 2011-07-08 MED ORDER — PRENATAL PLUS 27-1 MG PO TABS: 1.0000 | ORAL_TABLET | Freq: Every day | ORAL | Status: DC

## 2011-07-08 MED ORDER — CONCEPT DHA 53.5-38-1 MG PO CAPS: 1.0000 | ORAL_CAPSULE | Freq: Every day | ORAL | Status: DC
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
## 2011-07-08 NOTE — Assessment & Plan Note (Addendum)
Proteinuria noted on UA after patient left office. Patient returned to office for lab testing-cbc, cmp, urine cx, and 24 hour urine to evaluate for preeclampsia. Patient without SOB, RUQ pain, HA, edema

## 2011-07-08 NOTE — Progress Notes (Signed)
Addended by: Sherre Lain A on: 07/08/2011 11:04 AM   Modules accepted: Orders
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
## 2011-07-08 NOTE — Progress Notes (Signed)
Addended by: Shelva Majestic on: 07/08/2011 11:29 AM   Modules accepted: Orders

## 2011-07-08 NOTE — Progress Notes (Signed)
Has pain on lower right side

## 2011-07-08 NOTE — Progress Notes (Signed)
Addended by: Shelva Majestic on: 07/08/2011 11:13 AM   Modules accepted: Orders

## 2011-07-11 LAB — URINE CULTURE: Colony Count: 85000

## 2011-07-13 ENCOUNTER — Other Ambulatory Visit (INDEPENDENT_AMBULATORY_CARE_PROVIDER_SITE_OTHER): Payer: Medicaid Other

## 2011-07-13 ENCOUNTER — Other Ambulatory Visit: Payer: Self-pay | Admitting: Obstetrics and Gynecology

## 2011-07-13 DIAGNOSIS — O169 Unspecified maternal hypertension, unspecified trimester: Secondary | ICD-10-CM

## 2011-07-13 DIAGNOSIS — R809 Proteinuria, unspecified: Secondary | ICD-10-CM

## 2011-07-13 LAB — CBC
Hemoglobin: 11.7 g/dL — ABNORMAL LOW (ref 12.0–15.0)
MCH: 30.8 pg (ref 26.0–34.0)
MCHC: 33.5 g/dL (ref 30.0–36.0)
RDW: 13.1 % (ref 11.5–15.5)

## 2011-07-13 LAB — COMPREHENSIVE METABOLIC PANEL
AST: 17 U/L (ref 0–37)
Alkaline Phosphatase: 84 U/L (ref 39–117)
BUN: 6 mg/dL (ref 6–23)
Glucose, Bld: 107 mg/dL — ABNORMAL HIGH (ref 70–99)
Sodium: 137 mEq/L (ref 135–145)
Total Bilirubin: 0.3 mg/dL (ref 0.3–1.2)
Total Protein: 5.8 g/dL — ABNORMAL LOW (ref 6.0–8.3)

## 2011-07-15 NOTE — Progress Notes (Signed)
Addended by: Faythe Casa on: 07/15/2011 01:57 PM   Modules accepted: Orders

## 2011-07-16 LAB — CREATININE CLEARANCE, URINE, 24 HOUR
Creatinine Clearance: 81 mL/min (ref 75–115)
Creatinine, 24H Ur: 909 mg/d (ref 700–1800)
Creatinine: 0.78 mg/dL (ref 0.50–1.10)

## 2011-07-16 LAB — PROTEIN, URINE, 24 HOUR: Protein, 24H Urine: 60 mg/d (ref 50–100)

## 2011-07-17 ENCOUNTER — Encounter: Payer: Self-pay | Admitting: *Deleted

## 2011-07-21 ENCOUNTER — Inpatient Hospital Stay (HOSPITAL_COMMUNITY): Payer: Medicaid Other

## 2011-07-21 ENCOUNTER — Observation Stay (HOSPITAL_COMMUNITY)
Admission: AD | Admit: 2011-07-21 | Discharge: 2011-07-22 | DRG: 781 | Disposition: A | Discharge status: Home / Self Care | Payer: Medicaid Other | Source: Ambulatory Visit | Attending: Obstetrics & Gynecology | Admitting: Obstetrics & Gynecology

## 2011-07-21 ENCOUNTER — Encounter (HOSPITAL_COMMUNITY): Payer: Self-pay

## 2011-07-21 DIAGNOSIS — O99891 Other specified diseases and conditions complicating pregnancy: Principal | ICD-10-CM | POA: Diagnosis present

## 2011-07-21 DIAGNOSIS — M549 Dorsalgia, unspecified: Secondary | ICD-10-CM

## 2011-07-21 DIAGNOSIS — R51 Headache: Secondary | ICD-10-CM

## 2011-07-21 DIAGNOSIS — O9A219 Injury, poisoning and certain other consequences of external causes complicating pregnancy, unspecified trimester: Secondary | ICD-10-CM | POA: Diagnosis present

## 2011-07-21 DIAGNOSIS — W108XXA Fall (on) (from) other stairs and steps, initial encounter: Secondary | ICD-10-CM | POA: Diagnosis present

## 2011-07-21 DIAGNOSIS — Y92009 Unspecified place in unspecified non-institutional (private) residence as the place of occurrence of the external cause: Secondary | ICD-10-CM

## 2011-07-21 DIAGNOSIS — O9989 Other specified diseases and conditions complicating pregnancy, childbirth and the puerperium: Secondary | ICD-10-CM

## 2011-07-21 LAB — WET PREP, GENITAL: Yeast Wet Prep HPF POC: NONE SEEN

## 2011-07-21 MED ORDER — DOCUSATE SODIUM 100 MG PO CAPS
100.0000 mg | ORAL_CAPSULE | Freq: Every day | ORAL | Status: DC
  Administered 2011-07-22: 100 mg via ORAL
  Filled 2011-07-21 (×3): qty 1

## 2011-07-21 MED ORDER — PRENATAL PLUS 27-1 MG PO TABS
1.0000 | ORAL_TABLET | Freq: Every day | ORAL | Status: DC
  Administered 2011-07-22: 1 via ORAL
  Filled 2011-07-21 (×3): qty 1

## 2011-07-21 MED ORDER — CALCIUM CARBONATE ANTACID 500 MG PO CHEW
2.0000 | CHEWABLE_TABLET | ORAL | Status: DC | PRN
  Filled 2011-07-21: qty 2

## 2011-07-21 MED ORDER — CYCLOBENZAPRINE HCL 10 MG PO TABS
10.0000 mg | ORAL_TABLET | Freq: Three times a day (TID) | ORAL | Status: DC | PRN
  Administered 2011-07-21 – 2011-07-22 (×2): 10 mg via ORAL
  Filled 2011-07-21 (×2): qty 1

## 2011-07-21 MED ORDER — ZOLPIDEM TARTRATE 10 MG PO TABS
10.0000 mg | ORAL_TABLET | Freq: Every evening | ORAL | Status: DC | PRN
  Administered 2011-07-22: 10 mg via ORAL
  Filled 2011-07-21: qty 1

## 2011-07-21 MED ORDER — ACETAMINOPHEN 325 MG PO TABS
650.0000 mg | ORAL_TABLET | ORAL | Status: DC | PRN
  Administered 2011-07-22: 650 mg via ORAL
  Filled 2011-07-21: qty 2
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
## 2011-07-21 NOTE — Progress Notes (Signed)
Pt also states she had a gush of reddish brown bleeding-she took a shower and the bleeding stopped and has not seen any since

## 2011-07-22 DIAGNOSIS — O9A219 Injury, poisoning and certain other consequences of external causes complicating pregnancy, unspecified trimester: Secondary | ICD-10-CM | POA: Diagnosis present

## 2011-07-22 LAB — GC/CHLAMYDIA PROBE AMP, GENITAL
Chlamydia, DNA Probe: NEGATIVE
GC Probe Amp, Genital: NEGATIVE

## 2011-07-22 MED ORDER — CYCLOBENZAPRINE HCL 10 MG PO TABS: 10.0000 mg | ORAL_TABLET | Freq: Three times a day (TID) | ORAL | Status: DC | PRN

## 2011-07-22 MED ORDER — HYDROCODONE-ACETAMINOPHEN 5-325 MG PO TABS: 1.0000 | ORAL_TABLET | Freq: Four times a day (QID) | ORAL | Status: DC | PRN

## 2011-07-22 MED ORDER — HYDROCODONE-ACETAMINOPHEN 5-325 MG PO TABS
1.0000 | ORAL_TABLET | Freq: Four times a day (QID) | ORAL | Status: DC | PRN
  Administered 2011-07-22: 1 via ORAL
  Filled 2011-07-22: qty 1

## 2011-07-22 NOTE — Progress Notes (Signed)
Pt c/o hitting head on fall.top left parietal area.  No swelling no redness.  Red bruise across left leg above knee originating from lateral aspect small finger width and index finger length approximate.  Same type of bruise on right leg medial aspect. C/o low back pain and no bruising.  Pt refused pain meds or ice packs.
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
## 2011-07-22 NOTE — Progress Notes (Addendum)
Pt states that she fell on the way to the bathroom due to dizziness after getting up abruptly.  Fall not witnessed. No bleeding, abdominal tenderness, change in FHR or uterine activity. Not hypotensive. Pt encouraged to be more careful when changing positions and to do it more slowly.  Dr. Emelda Fear aware of situation and agrees with discharge.

## 2011-07-22 NOTE — Progress Notes (Signed)
Called to room by significant other, RN entering the room with the pt lying on the floor stating that she got really dizzy while going to the bathroom.  Pt assisted to the bed by CNA and RN.  VSS.
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
## 2011-07-24 NOTE — H&P (Signed)
Attestation of Attending Supervision of Advanced Practitioner: Evaluation and management procedures were performed by the PA/NP/CNM/OB Fellow under my supervision/collaboration. Chart reviewed and agree with management and plan.  ANYANWU,UGONNA A 07/24/2011 3:15 PM

## 2011-07-24 NOTE — ED Provider Notes (Signed)
Attestation of Attending Supervision of Advanced Practitioner: Evaluation and management procedures were performed by the PA/NP/CNM/OB Fellow under my supervision/collaboration. Chart reviewed and agree with management and plan.  Emberley Kral A 07/24/2011 3:14 PM   

## 2011-07-29 ENCOUNTER — Ambulatory Visit (INDEPENDENT_AMBULATORY_CARE_PROVIDER_SITE_OTHER): Payer: Medicaid Other | Admitting: Obstetrics and Gynecology

## 2011-07-29 DIAGNOSIS — Z331 Pregnant state, incidental: Secondary | ICD-10-CM

## 2011-07-29 LAB — POCT URINALYSIS DIP (DEVICE)
Glucose, UA: NEGATIVE mg/dL
Nitrite: NEGATIVE
Protein, ur: NEGATIVE mg/dL
Specific Gravity, Urine: 1.01 (ref 1.005–1.030)
Urobilinogen, UA: 0.2 mg/dL (ref 0.0–1.0)

## 2011-07-29 NOTE — Progress Notes (Signed)
Pulse 122. No vaginal discharge. Pt declines flu vaccine.

## 2011-07-29 NOTE — Progress Notes (Signed)
This patient is a 20 year old gravida 3 para 0020 at 37 weeks 1 day gestation. She's had a normal pregnancy with no problems and no complaints.  When seen today her blood pressure is 120/74 her pulse was 86 per minute weight is 136 pounds. The fetal heart is normal at 134 in the right lower quadrant. The baby palpates as a vertex. We've discussed labor delivery when to come in with the patient will also covered what would happen if she goes beyond her due date. We've encouraged her to take the hospital TOUR.

## 2011-07-30 ENCOUNTER — Inpatient Hospital Stay (HOSPITAL_COMMUNITY)
Admission: AD | Admit: 2011-07-30 | Discharge: 2011-07-30 | Disposition: A | Discharge status: Home / Self Care | Payer: Medicaid Other | Source: Ambulatory Visit | Attending: Obstetrics & Gynecology | Admitting: Obstetrics & Gynecology

## 2011-07-30 DIAGNOSIS — O99891 Other specified diseases and conditions complicating pregnancy: Secondary | ICD-10-CM | POA: Insufficient documentation

## 2011-07-30 DIAGNOSIS — R809 Proteinuria, unspecified: Secondary | ICD-10-CM

## 2011-07-30 DIAGNOSIS — T1490XA Injury, unspecified, initial encounter: Secondary | ICD-10-CM

## 2011-07-30 DIAGNOSIS — O9A219 Injury, poisoning and certain other consequences of external causes complicating pregnancy, unspecified trimester: Secondary | ICD-10-CM

## 2011-07-30 DIAGNOSIS — O98319 Other infections with a predominantly sexual mode of transmission complicating pregnancy, unspecified trimester: Secondary | ICD-10-CM

## 2011-07-30 LAB — AMNISURE RUPTURE OF MEMBRANE (ROM) NOT AT ARMC: Amnisure ROM: NEGATIVE

## 2011-07-30 MED ORDER — ZOLPIDEM TARTRATE 10 MG PO TABS
10.0000 mg | ORAL_TABLET | Freq: Once | ORAL | Status: AC
  Administered 2011-07-30: 10 mg via ORAL
  Filled 2011-07-30: qty 1

## 2011-07-30 NOTE — ED Provider Notes (Signed)
Chief Complaint:  Rupture of Membranes   Brandy Kidd is  20 y.o. G3P0020 at [redacted]w[redacted]d presents complaining of Rupture of Membranes .  She states irregular, every 3-6 minutes contractions associated with none vaginal bleeding, intact, ? ruptured membranes, along with active fetal movement. Pt states during intercourse this afternoon "a lot of water came out".  NO leaking since then.  Started having some contractions a few hours ago and called EMS to be evaluated here  Obstetrical/Gynecological History: Menstrual History:  No LMP recorded. Patient is pregnant.     Past Medical History: Past Medical History  Diagnosis Date  . No pertinent past medical history     Past Surgical History: Past Surgical History  Procedure Date  . No past surgeries     Family History: Family History  Problem Relation Age of Onset  . Alcohol abuse Mother   . Depression Mother   . Drug abuse Mother   . Heart disease Mother     mother had a closed valve and had heart surgery  . Alcohol abuse Father   . Drug abuse Father     Social History: History  Substance Use Topics  . Smoking status: Former Smoker -- 0.0 packs/day    Types: Cigarettes  . Smokeless tobacco: Not on file  . Alcohol Use: No    Allergies: No Known Allergies  Meds:  Prescriptions prior to admission  Medication Sig Dispense Refill  . cyclobenzaprine (FLEXERIL) 10 MG tablet Take 1 tablet (10 mg total) by mouth 3 (three) times daily as needed for muscle spasms.  30 tablet  1  . HYDROcodone-acetaminophen (NORCO) 5-325 MG per tablet Take 1 tablet by mouth every 6 (six) hours as needed.  30 tablet  0  . Prenat-FeFum-FePo-FA-Omega 3 (CONCEPT DHA) 53.5-38-1 MG CAPS Take 1 tablet by mouth daily.  8 capsule  0  . prenatal vitamin w/FE, FA (PRENATAL 1 + 1) 27-1 MG TABS Take 1 tablet by mouth daily.  30 each  1    Review of Systems - Please refer to the aforementioned patients' reports.   Negative except as mentioned above  Physical  Exam  There were no vitals taken for this visit. GENERAL: Well-developed, well-nourished female in no acute distress.  LUNGS: Clear to auscultation bilaterally.  HEART: Regular rate and rhythm. ABDOMEN: Soft, nontender, nondistended, gravid.  EXTREMITIES: Nontender, no edema, 2+ distal pulses. PELVIC:  SSE;  No pooling.  Amnisure collected d/t recent intercourse Cervical Exam: Dilatation 1.5cm   Effacement 50%   Station -1   Presentation: cephalic FHT:  Baseline rate 130 bpm   Variability moderate  Accelerations present   Decelerations none Contractions: Every 3-6 mins Amnisure negative   Labs: No results found for this or any previous visit (from the past 24 hour(s)). Imaging Studies:  Ct Head Wo Contrast  07/22/2011  *RADIOLOGY REPORT*  Clinical Data: Larey Seat and hit head.  Now with headache.  CT HEAD WITHOUT CONTRAST  Technique:  Contiguous axial images were obtained from the base of the skull through the vertex without contrast.  Comparison: None.  Findings: No evidence of acute intracranial abnormality. Specifically, no hemorrhage, hydrocephalus, mass effect, mass lesion, or evidence of acute infarction.  No abnormal extra-axial fluid collection.  No scalp swelling or hematoma is identified.  Orbital soft tissues are symmetric.  The visualized paranasal sinuses, mastoid air cells, and middle ears are clear.  The skull is intact.  IMPRESSION: Normal head CT.  Original Report Authenticated By: Britta Mccreedy, M.D.  Assessment: Brandy Kidd is  20 y.o. G3P0020 at [redacted]w[redacted]d presents with negative ROM  Plan: Pt instructed to return if contractions increase in frequency/intensity; vaginal bleeding and/or consistent leakage of amniotic fluid  CRESENZO-DISHMAN,Sheridan Gettel 10/4/20129:31 PM

## 2011-07-30 NOTE — Progress Notes (Signed)
Pt reports gush of fluid during intercourse @ 17:00

## 2011-07-30 NOTE — ED Provider Notes (Signed)
Attestation of Attending Supervision of Advanced Practitioner: Evaluation and management procedures were performed by the PA/NP/CNM/OB Fellow under my supervision/collaboration. Chart reviewed and agree with management and plan.  Lemma Tetro A 07/30/2011 10:59 PM

## 2011-07-31 ENCOUNTER — Inpatient Hospital Stay (HOSPITAL_COMMUNITY)
Admission: AD | Admit: 2011-07-31 | Discharge: 2011-07-31 | Disposition: A | Discharge status: Home / Self Care | Payer: Medicaid Other | Source: Ambulatory Visit | Attending: Obstetrics & Gynecology | Admitting: Obstetrics & Gynecology

## 2011-07-31 DIAGNOSIS — R5383 Other fatigue: Secondary | ICD-10-CM

## 2011-07-31 DIAGNOSIS — R110 Nausea: Secondary | ICD-10-CM | POA: Insufficient documentation

## 2011-07-31 DIAGNOSIS — O99891 Other specified diseases and conditions complicating pregnancy: Secondary | ICD-10-CM | POA: Insufficient documentation

## 2011-07-31 DIAGNOSIS — Z3480 Encounter for supervision of other normal pregnancy, unspecified trimester: Secondary | ICD-10-CM

## 2011-07-31 DIAGNOSIS — R5381 Other malaise: Secondary | ICD-10-CM

## 2011-07-31 NOTE — ED Provider Notes (Signed)
Chief Complaint:  Sleepy/queasy after taking Ahmaya [name] is  20 y.o. G3P0020 at [redacted]w[redacted]d presents complaining of feeling queasy after taking a sleeping pill.  She was seen in MAU earlier for r/o ROM and given am ambien per her request to "help her get some rest."  Pt reports feeling nauseated when up and moving around.  She fell asleep sitting up in th waiting room.  She states irregular, every 2-10 minutes contractions associated with none vaginal bleeding, intact membranes, along with active fetal movement.   Obstetrical/Gynecological History: Menstrual History:  No LMP recorded. Patient is pregnant.     Past Medical History: Past Medical History  Diagnosis Date  . No pertinent past medical history     Past Surgical History: Past Surgical History  Procedure Date  . No past surgeries     Family History: Family History  Problem Relation Age of Onset  . Alcohol abuse Mother   . Depression Mother   . Drug abuse Mother   . Heart disease Mother     mother had a closed valve and had heart surgery  . Alcohol abuse Father   . Drug abuse Father     Social History: History  Substance Use Topics  . Smoking status: Former Smoker -- 0.0 packs/day    Types: Cigarettes  . Smokeless tobacco: Not on file  . Alcohol Use: No    Allergies: No Known Allergies  Meds:  Prescriptions prior to admission  Medication Sig Dispense Refill  . prenatal vitamin w/FE, FA (PRENATAL 1 + 1) 27-1 MG TABS Take 1 tablet by mouth daily.  30 each  1    Review of Systems - Please refer to the aforementioned patients' reports.   Negative except sleepy  Physical Exam  There were no vitals taken for this visit. GENERAL: Well-developed, well-nourished female in no acute distress.  LUNGS: Clear to auscultation bilaterally.  HEART: Regular rate and rhythm. ABDOMEN: Soft, nontender, nondistended, gravid.  EXTREMITIES: Nontender, no edema, 2+ distal pulses. Cervical Exam: deferred .  Was  1.5/50/-1 at earlier visit; no new c/o as far as contractions  FHT:  Baseline rate 130 bpm   Variability moderate  Accelerations present   Decelerations none Contractions: mild and irregular  Labs: Recent Results (from the past 24 hour(s))  AMNISURE RUPTURE OF MEMBRANE (ROM)   Collection Time   07/30/11  9:14 PM      Component Value Range   Amnisure ROM NEGATIVE     Imaging Studies:  none  Assessment: Brandy Kidd is  20 y.o. G3P0020 at [redacted]w[redacted]d presents with sleepiness/queasiness associated with Ambien.  Plan: Go back home after reactive NST and go to bed  CRESENZO-DISHMAN,Algis Lehenbauer 10/5/20122:00 AM

## 2011-07-31 NOTE — Progress Notes (Signed)
PT ASLEEP

## 2011-07-31 NOTE — Progress Notes (Signed)
0245- PT SLEEPING-  OFF MONITOR SIDERAILS  UP .      PLAN - IS TO LET PT SLEEP.   PT UP /OUT OF BED.  SAYS GOING TO FIND RODNEY-BOYFRIEND.  I WALKED WITH PT DOWN TO SURGICAL  WAITING ROOM- RODNEY ASLEEP.    WALKED BACK TO MAU WAITING ROOM- WHERE JAMES- SECURITY LET USE PHONE CHARGER TO CALL SOMEONE TO GET THEM- HE DOESN'T KNOW PHONE NUMBERS.

## 2011-07-31 NOTE — Progress Notes (Signed)
PT LEFT MAU  EARLIER- D/C HOME WITH AMBIEN- PT HAS RETURNED  [facility]  .  FELL ASLEEP IN TRIAGE CHAIR.Marland Kitchen SAYS FEELS NAUSEA.

## 2011-08-04 NOTE — ED Provider Notes (Signed)
Attestation of Attending Supervision of Advanced Practitioner: Evaluation and management procedures were performed by the PA/NP/CNM/OB Fellow under my supervision/collaboration. Chart reviewed and agree with management and plan.  ANYANWU,UGONNA A 08/04/2011 1:24 PM   

## 2011-08-05 ENCOUNTER — Other Ambulatory Visit: Payer: Self-pay | Admitting: Obstetrics and Gynecology

## 2011-08-05 ENCOUNTER — Ambulatory Visit (INDEPENDENT_AMBULATORY_CARE_PROVIDER_SITE_OTHER): Payer: Medicaid Other | Admitting: Advanced Practice Midwife

## 2011-08-05 DIAGNOSIS — Z22330 Carrier of Group B streptococcus: Secondary | ICD-10-CM

## 2011-08-05 DIAGNOSIS — O479 False labor, unspecified: Secondary | ICD-10-CM

## 2011-08-05 DIAGNOSIS — Z331 Pregnant state, incidental: Secondary | ICD-10-CM

## 2011-08-05 DIAGNOSIS — Z87440 Personal history of urinary (tract) infections: Secondary | ICD-10-CM

## 2011-08-05 LAB — POCT URINALYSIS DIP (DEVICE)
Glucose, UA: NEGATIVE mg/dL
Nitrite: NEGATIVE
Protein, ur: NEGATIVE mg/dL
Urobilinogen, UA: 0.2 mg/dL (ref 0.0–1.0)

## 2011-08-05 NOTE — Patient Instructions (Signed)
Normal Labor and Delivery (First Baby) Your caregiver must first be sure you are in labor. Signs of labor include:  You may pass what is called "the mucus plug" before labor begins. This is a small amount of blood stained mucus.   Regular uterine contractions.   The time between contractions get closer together.   The discomfort and pain gradually gets more intense.   Pains are mostly located in the back.   Pains get worse when walking.   The cervix (the opening of the uterus becomes thinner (begins to efface) and opens up (dilates).  Once you are in labor and admitted into the hospital or care center, your caregiver will do the following:  A complete physical examination.   Check your vital signs (blood pressure, pulse, temperature and the fetal heart rate).   Do a vaginal examination (using a sterile glove and lubricant) to determine:   The position (presentation) of the baby (head [vertex] or buttock first).   The level (station) of the baby's head in the birth canal.   The effacement and dilatation of the cervix.   You may have your pubic hair shaved and be given an enema depending on your caregiver and the circumstance.   An electronic monitor is usually placed on your abdomen. The monitor follows the length and intensity of the contractions, as well as the baby's heart rate.   Usually, your caregiver will insert an IV in your arm with a bottle of sugar water. This is done as a precaution so that medications can be given to you quickly during labor or delivery.  NORMAL LABOR AND DELIVERY IS DIVIDED UP INTO THREE STAGES: First Stage This is when regular contractions begin and the cervix begins to efface and dilate. This stage can last from 3 to 15 hours. The end of the first stage is when the cervix is 100% effaced and 10 centimeters dilated. Pain medications may be given by   Injection (morphine, demerol, etc.)   Regional anesthesia (spinal, caudal or epidural,  anesthetics given in different locations of the spine). Paracervical pain medication may be given, which is an injection of and anesthetic (novocaine or xylocaine) on each side of the cervix.  A pregnant woman may request to have "Natural Childbirth" which is not to have any medications or anesthesia during her labor and delivery. Second Stage This is when the baby comes down through the birth canal (vagina) and is born. This can take 1 to 4 hours. As the baby's head comes down through the birth canal, you may feel like you are going to have a bowel movement. You will get the urge to bear down and push until the baby is delivered. As the baby's head is being delivered, the caregiver will decide if an episiotomy (a cut in the perineum and vagina area) is needed to prevent tearing of the tissue in this area. The episiotomy is sewn up after the delivery of the baby and placenta. Sometimes a mask with nitrous oxide is given for the mother to breath during the delivery of the baby to help if there is too much pain. The end of Stage 2 is when the baby is fully delivered. Then when the umbilical cord stops pulsating it is clamped and cut. Third Stage The third stage begins after the baby is completely delivered and ends after the placenta (afterbirth) is delivered. This usually takes 5 to 30 minutes. After the placenta is delivered, a medication is given either by intravenous  or injection to help contract the uterus and prevent bleeding. The third stage is not painful and pain medication is usually not necessary. If an episiotomy was done, it is repaired at this time. After the delivery, the mother is watched and monitored closely for 1 to 2 hours to make sure there is no postpartum bleeding (hemorrhage). If there is a lot of bleeding, medication is given to contract the uterus and stop the bleeding. Document Released: 07/21/2008 Document Re-Released: 11/03/2009 [facility] Patient Information 2011 [place],  [place].Group B Strep (GBS) Carrier State in a Pregnant Woman Group B Strep (GBS, carrier state) is a common infection found in pregnant women. It does not cause any symptoms. It can lead to a serious bacterial infection in a pregnant woman's baby. GBS is not a sexually transmitted disease and is not the same streptococcus bacteria that causes strep throat, which is Group A streptococcus.  CAUSES This carrier state is caused by bacteria called Group B Streptococcus or "Group B Strep (GBS)." GBS is found in the intestinal, reproductive and urinary tract. It can also be found in the female genital tract, most often in the vagina and rectal areas.  SYMPTOMS In pregnancy, GBS can be in the following places:  Genital tract without symptoms.   Rectum without symptoms.   Urine with or without symptoms (asymptomatic bacteriuria).   Symptoms can include pain, frequency, urgency and blood with urination (cystitis).  Having the bacteria in these places can lead to:  Early (premature) labor and delivery.   Prolonged rupture of the membranes.   GBS infection in the newborn.   Infection in the following places in the pregnant woman:   Bladder.  Kidneys (pyelonephritis).   Bag of waters or placenta (chorioamnionitis).   Lungs (pneumonia).   Bloodstream (septicemia).  Uterus (endometritis) - after delivery.   Spinal cord (meningitis).   DIAGNOSIS Diagnosis of GBS infection is made by screening tests done in pregnancy. Tests (cultures) are done on the vagina, rectum and urine for the bacteria. Cultures to detect the bacteria are usually done if you have any of the problems listed under the SYMPTOMS section above. TREATMENT Medicines that kill germs (antibiotics) are given if there is GBS infection in the urine. Antibiotics are given during labor to prevent GBS infection of the newborn. Antibiotics are also given if:  GBS is found in a culture from the vagina, rectum, or urine.   Infection of  the membranes (amnionitis) is suspected. Antibiotics are given that will cover all forms of bacteria including GBS.   Labor begins or there is rupture of the membranes before 37 weeks of the pregnancy and there is a great risk of delivering the baby.   There is a past history of an infant born with GBS infection.   The culture status is unknown (culture not performed or result not available) and there is:   Fever during labor.   Pre-term labor (less than 37 weeks of gestation).   Prolonged rupture of membranes (18 hours or more)  Treatment of the mother during labor is not recommended when:  There was a positive culture for GBS in a past pregnancy, but no actual infection developed. Sometimes, bacteria will be found on the surface of tissue but will not damage the tissue and cause a problem if your immune system prevents infection.   A planned Cesarean Section is done and there is no labor or ruptured membranes. This is true even if the mother tested positive for GBS.  There is a negative culture for GBS screening during the pregnancy, regardless of the risk factors during labor.  The newborn baby will receive antibiotics if the baby tested positive for GBS or has signs and symptoms that suggest GBS infection is present. It is not recommended to routinely give antibiotics to infants whose mothers received antibiotic treatment during labor. HOME CARE INSTRUCTIONS  Take all antibiotics as prescribed by your caregiver.   Continue with prenatal visits and care.   Return for follow-up appointments and cultures.   Follow your caregiver's instructions. Take all the medications given for you and your baby.   Watch the baby closely for fever, poor appetite, listlessness/tiredness, stiff body or neck, trouble breathing and convulsions.  SEEK MEDICAL CARE IF:  Pain with urination.   Frequent urination.   Blood in the urine.  SEEK IMMEDIATE MEDICAL CARE IF YOU DEVELOP:  An oral  temperature of 100.4 or higher, or as directed by your caregiver.   Pain in the back, side, or uterus.   Chills.   Abdominal swelling (distension) or pain.   Pre-term labor (less than 37 weeks of gestation).   Repeated vomiting and diarrhea.   Trouble breathing.   Confusion.   Stiff body or neck.  SEEK IMMEDIATE MEDICAL CARE FOR THE BABY IF THE BABY HAS:  An oral temperature of 100.4    Poor appetite.   Lack of action or activity.   Stiff body or neck.   Trouble breathing.   Convulsions.  MAKE SURE YOU:   Understand these instructions.   Will watch your condition.   Will get help right away if you are not doing well or get worse.  Document Released: 01/19/2008 Document Re-Released: 09/24/2008 [facility] Patient Information 2011 [place], [place].

## 2011-08-05 NOTE — Progress Notes (Signed)
I assessed pt and agree w/ previous note.  Brandy Kidd 08/05/2011 12:26 PM

## 2011-08-05 NOTE — Progress Notes (Signed)
Addended by: Dorathy Kinsman on: 08/05/2011 12:28 PM   Modules accepted: Orders

## 2011-08-05 NOTE — Progress Notes (Signed)
Brandy Kidd was seen in clinic today at 38w 1d. She had GBS and GC/CT performed at last visit. GC/CT is negative. GBS is positive. The patient was counseled about the treatment with PCN in labor. Patient voiced understanding. She had an episode of fairly regular contractions yesterday. The contractions slowed and have been irregular today. She denies LOF or vaginal bleeding. She states that the baby is moving well. Labor precautions were discussed. Patient will return in 1 week for LOB F/U.

## 2011-08-06 ENCOUNTER — Inpatient Hospital Stay (HOSPITAL_COMMUNITY)
Admission: AD | Admit: 2011-08-06 | Discharge: 2011-08-07 | Disposition: A | Discharge status: Home / Self Care | Payer: Medicaid Other | Source: Ambulatory Visit | Attending: Obstetrics & Gynecology | Admitting: Obstetrics & Gynecology

## 2011-08-06 DIAGNOSIS — O479 False labor, unspecified: Secondary | ICD-10-CM | POA: Insufficient documentation

## 2011-08-06 NOTE — Progress Notes (Signed)
Pt states her back is really hurting her and she thnks she is in labor

## 2011-08-07 DIAGNOSIS — A64 Unspecified sexually transmitted disease: Secondary | ICD-10-CM | POA: Insufficient documentation

## 2011-08-07 MED ORDER — NALBUPHINE HCL 10 MG/ML IJ SOLN
5.0000 mg | Freq: Once | INTRAMUSCULAR | Status: DC
  Filled 2011-08-07: qty 0.5

## 2011-08-07 MED ORDER — HYDROXYZINE HCL 50 MG/ML IM SOLN
50.0000 mg | Freq: Once | INTRAMUSCULAR | Status: DC
  Filled 2011-08-07: qty 1

## 2011-08-07 NOTE — ED Provider Notes (Signed)
Brandy Kidd is a 20 y.o. female presenting for term labor evaluation. Reports constant low back pain x 2 days with some contractions.  Denies LOF or VB.  +FM.  Upon standing for discharge- had yellowish-clear fluid leakage that pooled in floor.  SSE: no pooling, fern -, 1.5/50/-1 intact.   History OB History    Grav Para Term Preterm Abortions TAB SAB Ect Mult Living   3    2  2    0     Past Medical History  Diagnosis Date   No pertinent past medical history    Past Surgical History  Procedure Date   No past surgeries    Family History: family history includes Alcohol abuse in her father and mother; Depression in her mother; Drug abuse in her father and mother; and Heart disease in her mother. Social History:  reports that she has quit smoking. Her smoking use included Cigarettes. She smoked 0 packs per day. She does not have any smokeless tobacco history on file. She reports that she does not drink alcohol or use illicit drugs.  ROS  Dilation: 1 Effacement (%): 50 Station: -1 Exam by:: M.Topp,RN Blood pressure 121/65, pulse 88, temperature 98.3 F (36.8 C), temperature source Oral, resp. rate 20, height 5\' 6"  (1.676 m), weight 63.957 kg (141 lb), SpO2 99.00%. Exam  Physical Exam  Prenatal labs: ABO, Rh: --/--/A POS (06/16 0240) Antibody: NEG (06/16 0239) Rubella: 144.2 (06/16 0239) RPR: NON REAC (07/11 1025)  HBsAg: NEGATIVE (06/16 0239)  HIV: NON REACTIVE (07/11 1025)  GBS: Positive, Positive (08/14 0000)   Assessment/Plan: False labor Reassuring maternal/fetal status; FHR Cat 1 Normal discomforts of pregnancy Reviewed reasons to return to MAU (consistent closer/stronger UC's, ROM, decreased FM, VB) To keep scheduled PNV this week D/C to home    Joellyn Haff, SNM <08/07/2011, 1:15 AM

## 2011-08-07 NOTE — ED Provider Notes (Signed)
Brandy Kidd is a 20 y.o. female at [redacted]w[redacted]d presenting for term labor eval. She reports strong regular UC's, poas FM and denies LOF or VB.  History OB History    Grav Para Term Preterm Abortions TAB SAB Ect Mult Living   3    2  2    0     Past Medical History  Diagnosis Date  . No pertinent past medical history    Past Surgical History  Procedure Date  . No past surgeries    Family History: family history includes Alcohol abuse in her father and mother; Depression in her mother; Drug abuse in her father and mother; and Heart disease in her mother. Social History:  reports that she has quit smoking. Her smoking use included Cigarettes. She smoked 0 packs per day. She does not have any smokeless tobacco history on file. She reports that she does not drink alcohol or use illicit drugs.  ROS: Otherwise neg  Dilation: 1 Effacement (%): 50 Station: -1 Exam by:: M.Topp,RN Blood pressure 121/65, pulse 88, temperature 98.3 F (36.8 C), temperature source Oral, resp. rate 20, height 5\' 6"  (1.676 m), weight 63.957 kg (141 lb), SpO2 99.00%. Exam Physical Exam  General: mod distress w/ UC's initially, calmer after VE showed no change from previous exam.  Abd: Soft, NT, gravid S=D   EFM: 150's category I Toco: irreg mild-mod UC's Q 3-10+ minutes  Prenatal labs: ABO, Rh: --/--/A POS (06/16 0240) Antibody: NEG (06/16 0239) Rubella: 144.2 (06/16 0239) RPR: NON REAC (07/11 1025)  HBsAg: NEGATIVE (06/16 0239)  HIV: NON REACTIVE (07/11 1025)  GBS: Positive, Positive (08/14 0000)   MDM: 9147: Mod gush of fluid noted when pt got out of bed for D/C. Pt panicked initially, calmer when BF came back to room. Neg fern x 3, neg pooling. Likely leaking urine.  (Boyfriend smelled like alcohol, admitted he was drunk, vomited in the trash can. Displayed inappropriate behavior, but nothing violent.) 0100 No cervical change. BOW felt on exam.  Assessment/Plan: Assessment: 1. 38.3 week IUP 2. FHR  category I 3. False labor 4. Intact  Plan: 1. D/C home 2. F/U in 1 week in [facility] 3. Labor precautions and FKCs 4. Recommended that BF stay sober since baby is due any day.  Brandy Kidd 08/07/2011, 2:13 AM

## 2011-08-10 ENCOUNTER — Inpatient Hospital Stay (HOSPITAL_COMMUNITY)
Admission: AD | Admit: 2011-08-10 | Discharge: 2011-08-11 | Disposition: A | Discharge status: Home / Self Care | Payer: Medicaid Other | Source: Ambulatory Visit | Attending: Family Medicine | Admitting: Family Medicine

## 2011-08-10 ENCOUNTER — Encounter (HOSPITAL_COMMUNITY): Payer: Self-pay | Admitting: *Deleted

## 2011-08-10 DIAGNOSIS — O99891 Other specified diseases and conditions complicating pregnancy: Secondary | ICD-10-CM | POA: Insufficient documentation

## 2011-08-10 DIAGNOSIS — O98319 Other infections with a predominantly sexual mode of transmission complicating pregnancy, unspecified trimester: Secondary | ICD-10-CM

## 2011-08-10 DIAGNOSIS — R1031 Right lower quadrant pain: Secondary | ICD-10-CM | POA: Insufficient documentation

## 2011-08-10 DIAGNOSIS — A64 Unspecified sexually transmitted disease: Secondary | ICD-10-CM

## 2011-08-10 DIAGNOSIS — O9A219 Injury, poisoning and certain other consequences of external causes complicating pregnancy, unspecified trimester: Secondary | ICD-10-CM

## 2011-08-10 DIAGNOSIS — O471 False labor at or after 37 completed weeks of gestation: Secondary | ICD-10-CM

## 2011-08-10 DIAGNOSIS — W1809XA Striking against other object with subsequent fall, initial encounter: Secondary | ICD-10-CM | POA: Insufficient documentation

## 2011-08-10 DIAGNOSIS — R809 Proteinuria, unspecified: Secondary | ICD-10-CM

## 2011-08-10 LAB — URINALYSIS, ROUTINE W REFLEX MICROSCOPIC
Bilirubin Urine: NEGATIVE
Glucose, UA: NEGATIVE mg/dL
Ketones, ur: NEGATIVE mg/dL
Protein, ur: NEGATIVE mg/dL
pH: 7 (ref 5.0–8.0)

## 2011-08-10 LAB — URINE MICROSCOPIC-ADD ON

## 2011-08-10 MED ORDER — ACETAMINOPHEN 160 MG/5ML PO SOLN
650.0000 mg | Freq: Once | ORAL | Status: AC
  Administered 2011-08-10: 649.6 mg via ORAL
  Filled 2011-08-10: qty 20.3
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
## 2011-08-11 ENCOUNTER — Observation Stay (HOSPITAL_COMMUNITY)
Admission: AD | Admit: 2011-08-11 | Discharge: 2011-08-14 | Disposition: A | Discharge status: Home / Self Care | Payer: Medicaid Other | Source: Ambulatory Visit | Attending: Obstetrics & Gynecology | Admitting: Obstetrics & Gynecology

## 2011-08-11 DIAGNOSIS — O98319 Other infections with a predominantly sexual mode of transmission complicating pregnancy, unspecified trimester: Secondary | ICD-10-CM

## 2011-08-11 DIAGNOSIS — O9A219 Injury, poisoning and certain other consequences of external causes complicating pregnancy, unspecified trimester: Secondary | ICD-10-CM | POA: Diagnosis present

## 2011-08-11 DIAGNOSIS — A64 Unspecified sexually transmitted disease: Secondary | ICD-10-CM

## 2011-08-11 DIAGNOSIS — Z658 Other specified problems related to psychosocial circumstances: Secondary | ICD-10-CM

## 2011-08-11 DIAGNOSIS — O368190 Decreased fetal movements, unspecified trimester, not applicable or unspecified: Principal | ICD-10-CM | POA: Insufficient documentation

## 2011-08-11 DIAGNOSIS — Z3490 Encounter for supervision of normal pregnancy, unspecified, unspecified trimester: Secondary | ICD-10-CM | POA: Insufficient documentation

## 2011-08-11 DIAGNOSIS — R809 Proteinuria, unspecified: Secondary | ICD-10-CM

## 2011-08-11 MED ORDER — ACETAMINOPHEN 500 MG PO TABS
1000.0000 mg | ORAL_TABLET | Freq: Four times a day (QID) | ORAL | Status: DC | PRN
  Administered 2011-08-11: 1000 mg via ORAL
  Filled 2011-08-11: qty 2

## 2011-08-11 NOTE — Progress Notes (Signed)
PT SAYS SHE HAS NOT FELT BABY MOVE SINCE  THIS AM-  11.   SAYS SHE HAS NOT EATEN ANYTHING ALL DAY-  NO APPETITE.- NOT DRANK ANYTHING.   . NO UC.
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
## 2011-08-11 NOTE — H&P (Signed)
See MAU Provider Note for H&P for this observation. Cam Hai 08/11/2011 11:55 PM

## 2011-08-11 NOTE — ED Provider Notes (Addendum)
Agree with above.  FHR 130, reactive. Toco: irreg mild UC. VE per RN: 1/70/-2 (unchanged from clinic). Brandy Kidd  08/11/11 2:52 AM

## 2011-08-11 NOTE — ED Provider Notes (Signed)
Chart reviewed and agree with management and plan.  
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
## 2011-08-11 NOTE — ED Provider Notes (Signed)
I was present for the exam and agree with above.   Dorathy Kinsman 08/11/2011 4:37 AM

## 2011-08-12 MED ORDER — CALCIUM CARBONATE ANTACID 500 MG PO CHEW: 2.0000 | CHEWABLE_TABLET | ORAL | Status: DC | PRN

## 2011-08-12 MED ORDER — OXYCODONE-ACETAMINOPHEN 5-325 MG PO TABS
1.0000 | ORAL_TABLET | ORAL | Status: DC | PRN
  Administered 2011-08-12 – 2011-08-13 (×8): 1 via ORAL
  Filled 2011-08-12 (×9): qty 1

## 2011-08-12 MED ORDER — OXYCODONE-ACETAMINOPHEN 5-325 MG PO TABS
1.0000 | ORAL_TABLET | Freq: Once | ORAL | Status: AC
  Administered 2011-08-12: 1 via ORAL
  Filled 2011-08-12: qty 1

## 2011-08-12 MED ORDER — ACETAMINOPHEN 325 MG PO TABS: 650.0000 mg | ORAL_TABLET | ORAL | Status: DC | PRN

## 2011-08-12 MED ORDER — PRENATAL PLUS 27-1 MG PO TABS
1.0000 | ORAL_TABLET | Freq: Every day | ORAL | Status: DC
  Administered 2011-08-12 – 2011-08-14 (×3): 1 via ORAL
  Filled 2011-08-12 (×3): qty 1

## 2011-08-12 MED ORDER — DOCUSATE SODIUM 100 MG PO CAPS
100.0000 mg | ORAL_CAPSULE | Freq: Every day | ORAL | Status: DC
  Filled 2011-08-12 (×2): qty 1

## 2011-08-12 MED ORDER — ZOLPIDEM TARTRATE 10 MG PO TABS
10.0000 mg | ORAL_TABLET | Freq: Every evening | ORAL | Status: DC | PRN
  Administered 2011-08-14: 10 mg via ORAL
  Filled 2011-08-12: qty 1

## 2011-08-12 NOTE — ED Provider Notes (Signed)
Agree with above note.  Brandy Kidd H. 08/12/2011 10:04 AM
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
## 2011-08-12 NOTE — Plan of Care (Signed)
Social worker here and talked with pt.  Pt has no place to go tonight, may have a place to go tomorrow.
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
## 2011-08-13 NOTE — Progress Notes (Signed)
UR Chart review completed.  
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
## 2011-08-14 MED ORDER — ZOLPIDEM TARTRATE 10 MG PO TABS: 10.0000 mg | ORAL_TABLET | Freq: Every evening | ORAL | Status: DC | PRN
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
## 2011-08-14 NOTE — Progress Notes (Signed)
UR chart review completed.  
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
## 2011-08-14 NOTE — Progress Notes (Signed)
Sw spoke with Maralyn Sago from [facility] this morning.  Maralyn Sago told this Sw that she is interviewing a potential client Monday at noon.  She would like to meet with the pt on, Monday afternoon at 3:30pm to conduct an interview for potential admission.  Maralyn Sago is willing to come to the hospital or if pt is discharged, at her office ([address]; [address]; [place]).  Sw was not able to speak with the pt this morning since FOB is in the room. This guaranteed plan for pt's disposition and therefore can be discharged when medically stable.  Sw will attempt to meet with the pt alone and give her the information to shelter.

## 2011-08-19 ENCOUNTER — Inpatient Hospital Stay (HOSPITAL_COMMUNITY)
Admission: AD | Admit: 2011-08-19 | Discharge: 2011-08-22 | DRG: 775 | Disposition: A | Discharge status: Home / Self Care | Payer: Medicaid Other | Source: Ambulatory Visit | Attending: Obstetrics and Gynecology | Admitting: Obstetrics and Gynecology

## 2011-08-19 ENCOUNTER — Encounter (HOSPITAL_COMMUNITY): Payer: Self-pay | Admitting: *Deleted

## 2011-08-19 DIAGNOSIS — O98319 Other infections with a predominantly sexual mode of transmission complicating pregnancy, unspecified trimester: Secondary | ICD-10-CM

## 2011-08-19 DIAGNOSIS — O479 False labor, unspecified: Secondary | ICD-10-CM

## 2011-08-19 DIAGNOSIS — O4290 Premature rupture of membranes, unspecified as to length of time between rupture and onset of labor, unspecified weeks of gestation: Secondary | ICD-10-CM

## 2011-08-19 DIAGNOSIS — O9A219 Injury, poisoning and certain other consequences of external causes complicating pregnancy, unspecified trimester: Secondary | ICD-10-CM

## 2011-08-19 DIAGNOSIS — A64 Unspecified sexually transmitted disease: Secondary | ICD-10-CM

## 2011-08-19 DIAGNOSIS — O99892 Other specified diseases and conditions complicating childbirth: Secondary | ICD-10-CM | POA: Diagnosis present

## 2011-08-19 DIAGNOSIS — Z22330 Carrier of Group B streptococcus: Secondary | ICD-10-CM

## 2011-08-19 DIAGNOSIS — O4700 False labor before 37 completed weeks of gestation, unspecified trimester: Secondary | ICD-10-CM

## 2011-08-19 DIAGNOSIS — R809 Proteinuria, unspecified: Secondary | ICD-10-CM

## 2011-08-19 LAB — CBC
HCT: 34 % — ABNORMAL LOW (ref 36.0–46.0)
Hemoglobin: 11.7 g/dL — ABNORMAL LOW (ref 12.0–15.0)
MCH: 31.1 pg (ref 26.0–34.0)
MCHC: 34.4 g/dL (ref 30.0–36.0)
RBC: 3.76 MIL/uL — ABNORMAL LOW (ref 3.87–5.11)

## 2011-08-19 LAB — POCT FERN TEST: Fern Test: POSITIVE

## 2011-08-19 MED ORDER — OXYTOCIN 20 UNITS IN LACTATED RINGERS INFUSION - SIMPLE
1.0000 m[IU]/min | INTRAVENOUS | Status: DC
  Administered 2011-08-19: 2 m[IU]/min via INTRAVENOUS
  Administered 2011-08-20: 12 m[IU]/min via INTRAVENOUS
  Filled 2011-08-19 (×2): qty 1000

## 2011-08-19 MED ORDER — IBUPROFEN 600 MG PO TABS
600.0000 mg | ORAL_TABLET | Freq: Four times a day (QID) | ORAL | Status: DC | PRN
  Filled 2011-08-19: qty 1

## 2011-08-19 MED ORDER — OXYTOCIN 20 UNITS IN LACTATED RINGERS INFUSION - SIMPLE
125.0000 mL/h | Freq: Once | INTRAVENOUS | Status: AC
  Administered 2011-08-20: 125 mL/h via INTRAVENOUS

## 2011-08-19 MED ORDER — TERBUTALINE SULFATE 1 MG/ML IJ SOLN: 0.2500 mg | Freq: Once | INTRAMUSCULAR | Status: AC | PRN

## 2011-08-19 MED ORDER — LIDOCAINE HCL (PF) 1 % IJ SOLN
30.0000 mL | INTRAMUSCULAR | Status: DC | PRN
  Filled 2011-08-19 (×2): qty 30

## 2011-08-19 MED ORDER — ACETAMINOPHEN 325 MG PO TABS: 650.0000 mg | ORAL_TABLET | ORAL | Status: DC | PRN

## 2011-08-19 MED ORDER — HYDROXYZINE HCL 50 MG PO TABS
50.0000 mg | ORAL_TABLET | Freq: Four times a day (QID) | ORAL | Status: DC | PRN
  Filled 2011-08-19: qty 1

## 2011-08-19 MED ORDER — MISOPROSTOL 25 MCG QUARTER TABLET
25.0000 ug | ORAL_TABLET | ORAL | Status: DC | PRN
  Filled 2011-08-19: qty 1

## 2011-08-19 MED ORDER — PENICILLIN G POTASSIUM 5000000 UNITS IJ SOLR
2.5000 10*6.[IU] | INTRAVENOUS | Status: DC
  Administered 2011-08-20 (×2): 2.5 10*6.[IU] via INTRAVENOUS
  Filled 2011-08-19 (×10): qty 2.5

## 2011-08-19 MED ORDER — HYDROXYZINE HCL 50 MG/ML IM SOLN
50.0000 mg | Freq: Four times a day (QID) | INTRAMUSCULAR | Status: DC | PRN
  Filled 2011-08-19: qty 1

## 2011-08-19 MED ORDER — ONDANSETRON HCL 4 MG/2ML IJ SOLN: 4.0000 mg | Freq: Four times a day (QID) | INTRAMUSCULAR | Status: DC | PRN

## 2011-08-19 MED ORDER — OXYTOCIN BOLUS FROM INFUSION
500.0000 mL | Freq: Once | INTRAVENOUS | Status: DC
  Filled 2011-08-19: qty 500

## 2011-08-19 MED ORDER — LACTATED RINGERS IV SOLN
INTRAVENOUS | Status: DC
  Administered 2011-08-19 – 2011-08-20 (×2): via INTRAVENOUS

## 2011-08-19 MED ORDER — PENICILLIN G POTASSIUM 5000000 UNITS IJ SOLR
5.0000 10*6.[IU] | Freq: Once | INTRAVENOUS | Status: AC
  Administered 2011-08-19: 5 10*6.[IU] via INTRAVENOUS
  Filled 2011-08-19: qty 5

## 2011-08-19 MED ORDER — OXYCODONE-ACETAMINOPHEN 5-325 MG PO TABS: 2.0000 | ORAL_TABLET | ORAL | Status: DC | PRN

## 2011-08-19 MED ORDER — CITRIC ACID-SODIUM CITRATE 334-500 MG/5ML PO SOLN: 30.0000 mL | ORAL | Status: DC | PRN

## 2011-08-19 MED ORDER — FLEET ENEMA 7-19 GM/118ML RE ENEM: 1.0000 | ENEMA | RECTAL | Status: DC | PRN

## 2011-08-19 MED ORDER — NALBUPHINE SYRINGE 5 MG/0.5 ML
5.0000 mg | INJECTION | INTRAMUSCULAR | Status: DC | PRN
  Filled 2011-08-19: qty 0.5

## 2011-08-19 MED ORDER — LACTATED RINGERS IV SOLN: 500.0000 mL | INTRAVENOUS | Status: DC | PRN

## 2011-08-19 MED ORDER — ZOLPIDEM TARTRATE 10 MG PO TABS
10.0000 mg | ORAL_TABLET | Freq: Once | ORAL | Status: AC
  Administered 2011-08-19: 10 mg via ORAL
  Filled 2011-08-19: qty 1

## 2011-08-19 MED ORDER — HYDROCODONE-ACETAMINOPHEN 5-325 MG PO TABS
1.0000 | ORAL_TABLET | Freq: Once | ORAL | Status: AC
  Administered 2011-08-19: 1 via ORAL
  Filled 2011-08-19: qty 1

## 2011-08-19 MED ORDER — ZOLPIDEM TARTRATE 10 MG PO TABS: 10.0000 mg | ORAL_TABLET | Freq: Every evening | ORAL | Status: DC | PRN

## 2011-08-19 NOTE — H&P (Signed)
Brandy Kidd is a 20 y.o. female G3P0020 with IUP at [redacted]w[redacted]d presenting for early labor/SROM. Pt states she has been having regular, every 5-8 minutes, associated with spotting vaginal bleeding, intact, ruptured, clear fluid right before she was going to be discharged.active.   [facility]  since 25 wks  Prenatal History/Complications:  Past Medical History: Past Medical History  Diagnosis Date  . No pertinent past medical history     Past Surgical History: Past Surgical History  Procedure Date  . No past surgeries     Obstetrical History: OB History    Grav Para Term Preterm Abortions TAB SAB Ect Mult Living   3    2  2    0      Gynecological History: OB History    Grav Para Term Preterm Abortions TAB SAB Ect Mult Living   3    2  2    0      Social History: History   Social History  . Marital Status: Single    Spouse Name: N/A    Number of Children: N/A  . Years of Education: N/A   Social History Main Topics  . Smoking status: Former Smoker -- 0.0 packs/day    Types: Cigarettes  . Smokeless tobacco: Never Used  . Alcohol Use: No  . Drug Use: No  . Sexually Active: Yes    Birth Control/ Protection: None   Other Topics Concern  . None   Social History Narrative  . None    Family History: Family History  Problem Relation Age of Onset  . Alcohol abuse Mother   . Depression Mother   . Drug abuse Mother   . Heart disease Mother     mother had a closed valve and had heart surgery  . Alcohol abuse Father   . Drug abuse Father     Allergies: No Known Allergies  Prescriptions prior to admission  Medication Sig Dispense Refill  . cyclobenzaprine (FLEXERIL) 10 MG tablet Take 10 mg by mouth 3 (three) times daily as needed. Muscle spasm       . prenatal vitamin w/FE, FA (PRENATAL 1 + 1) 27-1 MG TABS Take 1 tablet by mouth daily.  30 each  1  . zolpidem (AMBIEN) 10 MG tablet Take 1 tablet (10 mg total) by mouth at bedtime as needed for sleep.  20 tablet   0    Review of Systems - Negative except as above   Blood pressure 121/55, pulse 91, temperature 97.8 F (36.6 C), temperature source Oral, resp. rate 18, height 5\' 6"  (1.676 m), weight 64.229 kg (141 lb 9.6 oz), SpO2 97.00%. General appearance: alert and mild distress Lungs: clear to auscultation bilaterally Heart: regular rate and rhythm Abdomen: soft, non-tender; bowel sounds normal Extremities: Homans sign is negative, no sign of DVT DTR's 2+ Presentation: cephalic Baseline: 140's, avg LTV, + accels, no decels bpm Contractions mild to mod, q 3-5 min Dilation: 3 Effacement (%): 80 Station: -2 Exam by:: Bernita Buffy, CNM   Prenatal labs: ABO, Rh: --/--/A POS (06/16 0240) Antibody: NEG (06/16 0239) Rubella:  immune RPR: NON REAC (07/11 1025)  HBsAg: NEGATIVE (06/16 0239)  HIV: NON REACTIVE (07/11 1025)  GBS: Positive, Positive (08/14 0000)  1 hr Glucola 107 Genetic screening  Too late Anatomy US normal   Assessment: Brandy Kidd is a 20 y.o. G3P0020 with an IUP at [redacted]w[redacted]d presenting for SROM/early labor  Plan: Admit, GBS prophylaxis   CRESENZO-DISHMAN,Shalaine Payson 08/19/2011, 7:53 PM

## 2011-08-19 NOTE — Progress Notes (Signed)
Patient states she has been having contractions since last night., getting stronger. Denies any bleeding or leaking and reports good fetal movement.
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
## 2011-08-19 NOTE — Progress Notes (Signed)
   Brandy Kidd is a 20 y.o. G3P0020 at [redacted]w[redacted]d  admitted for rupture of membranes  Subjective: Feels like contractions are 'not that strong"  Objective: BP 143/90  Pulse 94  Temp(Src) 97.8 F (36.6 C) (Oral)  Resp 18  Ht 5\' 6"  (1.676 m)  Wt 64.229 kg (141 lb 9.6 oz)  BMI 22.85 kg/m2  SpO2 97%    FHT:  FHR: 140 bpm, variability: moderate,  accelerations:  Present,  decelerations:  Absent UC:   irregular, every 3-10 minutes SVE:   Dilation: 3 Effacement (%): 80 Station: -2 Exam by:: F. Cresenzo-dishman  Labs: Lab Results  Component Value Date   WBC 14.4* 08/19/2011   HGB 11.7* 08/19/2011   HCT 34.0* 08/19/2011   MCV 90.4 08/19/2011   PLT 247 08/19/2011    Assessment / Plan: Protracted latent phase; will start pitocin  Labor: PROM Fetal Wellbeing:  Category I Pain Control:  Labor support without medications Anticipated MOD:  NSVD  CRESENZO-DISHMAN,Antoin Dargis 08/19/2011, 10:59 PM

## 2011-08-19 NOTE — Progress Notes (Signed)
Pt in for labor eval, reports ucs since last night that have progressively gotten closer and stronger.  Denies any bleeding or leaking of fluid.  + FM.

## 2011-08-20 ENCOUNTER — Encounter (HOSPITAL_COMMUNITY): Payer: Self-pay

## 2011-08-20 ENCOUNTER — Encounter (HOSPITAL_COMMUNITY): Payer: Self-pay | Admitting: Anesthesiology

## 2011-08-20 ENCOUNTER — Inpatient Hospital Stay (HOSPITAL_COMMUNITY): Payer: Medicaid Other | Admitting: Anesthesiology

## 2011-08-20 LAB — RPR: RPR Ser Ql: NONREACTIVE

## 2011-08-20 MED ORDER — ONDANSETRON HCL 4 MG PO TABS: 4.0000 mg | ORAL_TABLET | ORAL | Status: DC | PRN

## 2011-08-20 MED ORDER — PRENATAL PLUS 27-1 MG PO TABS
1.0000 | ORAL_TABLET | Freq: Every day | ORAL | Status: DC
  Administered 2011-08-20: 1 via ORAL
  Filled 2011-08-20 (×2): qty 1

## 2011-08-20 MED ORDER — BENZOCAINE-MENTHOL 20-0.5 % EX AERO
INHALATION_SPRAY | CUTANEOUS | Status: AC
  Administered 2011-08-20: 19:00:00
  Filled 2011-08-20: qty 56

## 2011-08-20 MED ORDER — ZOLPIDEM TARTRATE 5 MG PO TABS: 5.0000 mg | ORAL_TABLET | Freq: Every evening | ORAL | Status: DC | PRN

## 2011-08-20 MED ORDER — BENZOCAINE-MENTHOL 20-0.5 % EX AERO
1.0000 "application " | INHALATION_SPRAY | CUTANEOUS | Status: DC | PRN
  Administered 2011-08-20 – 2011-08-21 (×2): 1 via TOPICAL

## 2011-08-20 MED ORDER — EPHEDRINE 5 MG/ML INJ
10.0000 mg | INTRAVENOUS | Status: DC | PRN
  Filled 2011-08-20: qty 4

## 2011-08-20 MED ORDER — DIPHENHYDRAMINE HCL 25 MG PO CAPS: 25.0000 mg | ORAL_CAPSULE | Freq: Four times a day (QID) | ORAL | Status: DC | PRN

## 2011-08-20 MED ORDER — PHENYLEPHRINE 40 MCG/ML (10ML) SYRINGE FOR IV PUSH (FOR BLOOD PRESSURE SUPPORT)
80.0000 ug | PREFILLED_SYRINGE | INTRAVENOUS | Status: DC | PRN
  Filled 2011-08-20 (×2): qty 5

## 2011-08-20 MED ORDER — FENTANYL 2.5 MCG/ML BUPIVACAINE 1/10 % EPIDURAL INFUSION (WH - ANES)
14.0000 mL/h | INTRAMUSCULAR | Status: DC
  Administered 2011-08-20 (×2): 14 mL/h via EPIDURAL
  Filled 2011-08-20 (×3): qty 60

## 2011-08-20 MED ORDER — WITCH HAZEL-GLYCERIN EX PADS: 1.0000 "application " | MEDICATED_PAD | CUTANEOUS | Status: DC | PRN

## 2011-08-20 MED ORDER — ONDANSETRON HCL 4 MG/2ML IJ SOLN: 4.0000 mg | INTRAMUSCULAR | Status: DC | PRN

## 2011-08-20 MED ORDER — SIMETHICONE 80 MG PO CHEW: 80.0000 mg | CHEWABLE_TABLET | ORAL | Status: DC | PRN

## 2011-08-20 MED ORDER — PHENYLEPHRINE 40 MCG/ML (10ML) SYRINGE FOR IV PUSH (FOR BLOOD PRESSURE SUPPORT)
80.0000 ug | PREFILLED_SYRINGE | INTRAVENOUS | Status: DC | PRN
  Filled 2011-08-20: qty 5

## 2011-08-20 MED ORDER — LACTATED RINGERS IV SOLN: 500.0000 mL | Freq: Once | INTRAVENOUS | Status: DC

## 2011-08-20 MED ORDER — OXYCODONE-ACETAMINOPHEN 5-325 MG PO TABS
1.0000 | ORAL_TABLET | ORAL | Status: DC | PRN
  Administered 2011-08-20 (×2): 1 via ORAL
  Administered 2011-08-21: 2 via ORAL
  Administered 2011-08-21: 1 via ORAL
  Administered 2011-08-22: 2 via ORAL
  Filled 2011-08-20: qty 2
  Filled 2011-08-20 (×3): qty 1
  Filled 2011-08-20: qty 2

## 2011-08-20 MED ORDER — TETANUS-DIPHTH-ACELL PERTUSSIS 5-2.5-18.5 LF-MCG/0.5 IM SUSP
0.5000 mL | Freq: Once | INTRAMUSCULAR | Status: DC
  Filled 2011-08-20: qty 0.5

## 2011-08-20 MED ORDER — DIBUCAINE 1 % RE OINT: 1.0000 "application " | TOPICAL_OINTMENT | RECTAL | Status: DC | PRN

## 2011-08-20 MED ORDER — EPHEDRINE 5 MG/ML INJ
10.0000 mg | INTRAVENOUS | Status: DC | PRN
  Filled 2011-08-20 (×2): qty 4

## 2011-08-20 MED ORDER — FENTANYL 2.5 MCG/ML BUPIVACAINE 1/10 % EPIDURAL INFUSION (WH - ANES)
INTRAMUSCULAR | Status: DC | PRN
  Administered 2011-08-20: 14 mL/h via EPIDURAL

## 2011-08-20 MED ORDER — SODIUM BICARBONATE 8.4 % IV SOLN
INTRAVENOUS | Status: DC | PRN
  Administered 2011-08-20: 5 mL via EPIDURAL

## 2011-08-20 MED ORDER — DIPHENHYDRAMINE HCL 50 MG/ML IJ SOLN
12.5000 mg | INTRAMUSCULAR | Status: DC | PRN
  Administered 2011-08-20: 12.5 mg via INTRAVENOUS
  Filled 2011-08-20: qty 1

## 2011-08-20 MED ORDER — SENNOSIDES-DOCUSATE SODIUM 8.6-50 MG PO TABS
2.0000 | ORAL_TABLET | Freq: Every day | ORAL | Status: DC
  Administered 2011-08-20: 2 via ORAL

## 2011-08-20 MED ORDER — LANOLIN HYDROUS EX OINT: TOPICAL_OINTMENT | CUTANEOUS | Status: DC | PRN

## 2011-08-20 MED ORDER — IBUPROFEN 600 MG PO TABS
600.0000 mg | ORAL_TABLET | Freq: Four times a day (QID) | ORAL | Status: DC
  Administered 2011-08-20 – 2011-08-22 (×8): 600 mg via ORAL
  Filled 2011-08-20 (×7): qty 1

## 2011-08-20 NOTE — Progress Notes (Signed)
UR Chart review completed.  

## 2011-08-20 NOTE — Progress Notes (Signed)
Delivery of live viable female by Ilean Skill, CNM. APGARS 9,9
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
## 2011-08-20 NOTE — Anesthesia Preprocedure Evaluation (Signed)
Anesthesia Evaluation  Patient identified by MRN, date of birth, ID band Patient awake  General Assessment Comment  Reviewed: Allergy & Precautions, H&P , Patient's Chart, lab work & pertinent test results  Airway Mallampati: II TM Distance: >3 FB Neck ROM: full    Dental  (+) Teeth Intact   Pulmonary  clear to auscultation        Cardiovascular regular Normal    Neuro/Psych    GI/Hepatic   Endo/Other    Renal/GU      Musculoskeletal   Abdominal   Peds  Hematology   Anesthesia Other Findings       Reproductive/Obstetrics (+) Pregnancy                           Anesthesia Physical Anesthesia Plan  ASA: II  Anesthesia Plan: Epidural   Post-op Pain Management:    Induction:   Airway Management Planned:   Additional Equipment:   Intra-op Plan:   Post-operative Plan:   Informed Consent: I have reviewed the patients History and Physical, chart, labs and discussed the procedure including the risks, benefits and alternatives for the proposed anesthesia with the patient or authorized representative who has indicated his/her understanding and acceptance.   Dental Advisory Given  Plan Discussed with:   Anesthesia Plan Comments: (Labs checked- platelets confirmed with RN in room. Fetal heart tracing, per RN, reported to be stable enough for sitting procedure. Discussed epidural, and patient consents to the procedure:  included risk of possible headache,backache, failed block, allergic reaction, and nerve injury. This patient was asked if she had any questions or concerns before the procedure started. )        Anesthesia Quick Evaluation  

## 2011-08-20 NOTE — Anesthesia Postprocedure Evaluation (Signed)
  Anesthesia Post-op Note  Patient: Brandy Kidd  Procedure(s) Performed: * No procedures listed *  Patient Location: PACU and Mother/Baby  Anesthesia Type: Epidural  Level of Consciousness: awake, alert  and oriented  Airway and Oxygen Therapy: Patient Spontanous Breathing  Post-op Pain: none  Post-op Assessment: Post-op Vital signs reviewed  Post-op Vital Signs: Reviewed and stable  Complications: No apparent anesthesia complications

## 2011-08-20 NOTE — Progress Notes (Signed)
Transferred to [facility] rm 111

## 2011-08-20 NOTE — Progress Notes (Signed)
   Brandy Kidd is a 20 y.o. G3P0020 at [redacted]w[redacted]d  admitted for rupture of membranes  Subjective: Comfortable with epidural  Objective: BP 136/94  Pulse 89  Temp(Src) 97.5 F (36.4 C) (Oral)  Resp 18  Ht 5\' 6"  (1.676 m)  Wt 64.229 kg (141 lb 9.6 oz)  BMI 22.85 kg/m2  SpO2 97%   Pit at 16 mu/min FHT:  FHR: 140 bpm, variability: moderate,  accelerations:  Present,  decelerations:  Absent UC:   irregular, every 2-3 minutes.  MVU's 160 SVE:   Dilation: 4.5 Effacement (%): 100 Station: -1 Exam by:: F.Cresenzo.CNM  Labs: Lab Results  Component Value Date   WBC 14.4* 08/19/2011   HGB 11.7* 08/19/2011   HCT 34.0* 08/19/2011   MCV 90.4 08/19/2011   PLT 247 08/19/2011    Assessment / Plan: continue present management  Labor: progressing Fetal Wellbeing:  Category I Pain Control:  Epidural Anticipated MOD:  NSVD  CRESENZO-DISHMAN,Patrick Sohm 08/20/2011, 6:12 AM

## 2011-08-20 NOTE — Anesthesia Procedure Notes (Signed)

## 2011-08-20 NOTE — Progress Notes (Signed)
   Brandy Kidd is a 20 y.o. G3P0020 at [redacted]w[redacted]d  admitted for rupture of membranes  Subjective:   Objective: BP 116/72  Pulse 80  Temp(Src) 97.4 F (36.3 C) (Oral)  Resp 18  Ht 5\' 6"  (1.676 m)  Wt 64.229 kg (141 lb 9.6 oz)  BMI 22.85 kg/m2  SpO2 97% Pitocin @ 6 mu/min (not increased due to RN concerned that early decels may be late decels.  IUPC placed to help get better tracing of contractions.    FHT:  FHR: 140 bpm, variability: moderate,  accelerations:  Present,  decelerations:  Present early UC:   irregular, every 2-5 minutes SVE:   Dilation: 3 Effacement (%): 80 Station: -2 Exam by:: F.Cresenzo, cnm  Labs: Lab Results  Component Value Date   WBC 14.4* 08/19/2011   HGB 11.7* 08/19/2011   HCT 34.0* 08/19/2011   MCV 90.4 08/19/2011   PLT 247 08/19/2011    Assessment / Plan: PROM, not in labor yet.  IUPC placed, continue to increase pitocin  Labor: PROM Fetal Wellbeing:  Category I Pain Control:  Labor support without medications Anticipated MOD:  NSVD  CRESENZO-DISHMAN,Duron Meister 08/20/2011, 2:47 AM
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
## 2011-08-21 LAB — CBC
HCT: 31.3 % — ABNORMAL LOW (ref 36.0–46.0)
MCHC: 34.2 g/dL (ref 30.0–36.0)
MCV: 92.1 fL (ref 78.0–100.0)
Platelets: 226 10*3/uL (ref 150–400)
RDW: 13.2 % (ref 11.5–15.5)

## 2011-08-21 MED ORDER — BENZOCAINE-MENTHOL 20-0.5 % EX AERO
INHALATION_SPRAY | CUTANEOUS | Status: AC
  Administered 2011-08-21: 1 via TOPICAL
  Filled 2011-08-21: qty 56

## 2011-08-21 MED ORDER — PRENATAL PLUS 27-1 MG PO TABS
1.0000 | ORAL_TABLET | Freq: Every day | ORAL | Status: DC
  Administered 2011-08-21 – 2011-08-22 (×2): 1 via ORAL
  Filled 2011-08-21: qty 1

## 2011-08-21 MED ORDER — ZOLPIDEM TARTRATE 10 MG PO TABS: 10.0000 mg | ORAL_TABLET | Freq: Every evening | ORAL | Status: DC | PRN

## 2011-08-21 MED ORDER — PNEUMOCOCCAL VAC POLYVALENT 25 MCG/0.5ML IJ INJ
0.5000 mL | INJECTION | Freq: Once | INTRAMUSCULAR | Status: DC
  Filled 2011-08-21: qty 0.5

## 2011-08-21 NOTE — Progress Notes (Signed)
Post Partum Day 1 Subjective: up ad lib, voiding, tolerating PO, + flatus and Reports some lightheadedness with standing and heavier than a period bleeding. Is breastfeeding, and is currently unsure about birth control but may get depo because she does want another child soon.  Objective: Blood pressure 109/66, pulse 96, temperature 98.1 F (36.7 C), temperature source Oral, resp. rate 18, height 5\' 6"  (1.676 m), weight 141 lb 9.6 oz (64.229 kg), SpO2 97.00%, unknown if currently breastfeeding.  Physical Exam:  General: alert, distracted and no distress Heart: RRR, 2/6 SEM Lungs: CTA B/L Abd: + BS, soft Uterine Fundus: firm, at umbilicus DVT Evaluation: No evidence of DVT seen on physical exam.   Basename 08/19/11 2026  HGB 11.7*  HCT 34.0*    Assessment/Plan: Plan for discharge tomorrow, Breastfeeding, Social Work consult and Contraception ?Depo Will get CBC to eval for report of bleeding and lightheadedness   LOS: 2 days   Safal Halderman N 08/21/2011, 2:00 PM
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
## 2011-08-22 MED ORDER — OXYCODONE-ACETAMINOPHEN 5-325 MG PO TABS: 1.0000 | ORAL_TABLET | ORAL | Status: AC | PRN

## 2011-08-22 MED ORDER — BENZOCAINE-MENTHOL 20-0.5 % EX AERO
INHALATION_SPRAY | CUTANEOUS | Status: AC
  Filled 2011-08-22: qty 56

## 2011-08-22 MED ORDER — IBUPROFEN 600 MG PO TABS: 600.0000 mg | ORAL_TABLET | Freq: Four times a day (QID) | ORAL | Status: AC

## 2011-08-22 MED ORDER — PRENATAL PLUS 27-1 MG PO TABS: 1.0000 | ORAL_TABLET | Freq: Every day | ORAL | Status: DC

## 2011-08-22 NOTE — Progress Notes (Signed)
Post Partum Day 2 Subjective: no complaints, up ad lib, voiding and tolerating PO  Objective: Blood pressure 115/78, pulse 90, temperature 97.7 F (36.5 C), temperature source Oral, resp. rate 18, height 5\' 6"  (1.676 m), weight 64.229 kg (141 lb 9.6 oz), SpO2 97.00%, unknown if currently breastfeeding.  Physical Exam:  General: alert, cooperative, appears stated age and no distress Lochia: appropriate Uterine Fundus: firm Incision: n/a DVT Evaluation: No evidence of DVT seen on physical exam. Negative Homan's sign. No cords or calf tenderness. No significant calf/ankle edema.   Basename 08/21/11 1407 08/19/11 2026  HGB 10.7* 11.7*  HCT 31.3* 34.0*    Assessment/Plan: Discharge home   LOS: 3 days   Zerita Boers 08/22/2011, 8:47 AM

## 2011-08-22 NOTE — Discharge Summary (Deleted)
Entered in error

## 2011-08-22 NOTE — Discharge Summary (Signed)
Agree with above note.  Nathanyal Ashmead H. 08/22/2011 9:33 PM

## 2011-08-22 NOTE — Progress Notes (Deleted)
Entered in error

## 2011-08-22 NOTE — Discharge Summary (Signed)
Obstetric Discharge Summary Reason for Admission: onset of labor Prenatal Procedures: NST Intrapartum Procedures: spontaneous vaginal delivery Postpartum Procedures: none Complications-Operative and Postpartum: none Hemoglobin  Date Value Range Status  08/21/2011 10.7* 12.0-15.0 (g/dL) Final     HCT  Date Value Range Status  08/21/2011 31.3* 36.0-46.0 (%) Final    Discharge Diagnoses: Term Pregnancy-delivered  Discharge Information: Date: 08/22/2011 Activity: unrestricted and pelvic rest Diet: routine Medications: PNV and Ibuprofen Condition: stable and improved Instructions: refer to practice specific booklet Discharge to: home Follow-up Information    Follow up with [facility] in 6 weeks.         Newborn Data: Live born female  Birth Weight: 6 lb 10 oz (3005 g) APGAR: 9, 9  Home with mother.  Brandy Kidd 08/22/2011, 8:48 AM

## 2011-08-24 NOTE — ED Provider Notes (Signed)
Agree with above note.  Brandy Kidd 08/24/2011 11:35 AM

## 2011-08-25 NOTE — H&P (Signed)
Attestation of Attending Supervision of Advanced Practitioner: Evaluation and management procedures were performed by the PA/NP/CNM/OB Fellow under my supervision/collaboration. Chart reviewed and agree with management and plan.  Meliana Canner V 08/25/2011 7:03 PM

## 2011-08-28 ENCOUNTER — Encounter: Payer: Self-pay | Admitting: Advanced Practice Midwife

## 2011-09-04 ENCOUNTER — Encounter (HOSPITAL_COMMUNITY): Payer: Self-pay | Admitting: *Deleted

## 2011-09-04 ENCOUNTER — Inpatient Hospital Stay (HOSPITAL_COMMUNITY)
Admission: AD | Admit: 2011-09-04 | Discharge: 2011-09-04 | Disposition: A | Discharge status: Home / Self Care | Payer: Medicaid Other | Source: Ambulatory Visit | Attending: Obstetrics & Gynecology | Admitting: Obstetrics & Gynecology

## 2011-09-04 DIAGNOSIS — O99893 Other specified diseases and conditions complicating puerperium: Secondary | ICD-10-CM | POA: Insufficient documentation

## 2011-09-04 DIAGNOSIS — IMO0002 Reserved for concepts with insufficient information to code with codable children: Secondary | ICD-10-CM

## 2011-09-04 DIAGNOSIS — L03119 Cellulitis of unspecified part of limb: Secondary | ICD-10-CM

## 2011-09-04 DIAGNOSIS — L02419 Cutaneous abscess of limb, unspecified: Secondary | ICD-10-CM | POA: Insufficient documentation

## 2011-09-04 MED ORDER — IBUPROFEN 600 MG PO TABS
600.0000 mg | ORAL_TABLET | Freq: Once | ORAL | Status: AC
  Administered 2011-09-04: 600 mg via ORAL
  Filled 2011-09-04: qty 1

## 2011-09-04 MED ORDER — SULFAMETHOXAZOLE-TRIMETHOPRIM 800-160 MG PO TABS: 2.0000 | ORAL_TABLET | Freq: Two times a day (BID) | ORAL | Status: AC

## 2011-09-04 NOTE — ED Provider Notes (Signed)
History     Chief Complaint  Patient presents with  . Blister   HPI Is a 20 year old who is approximately one week postpartum who presents the MAU with a right groin lesion that started approximately 4 days ago with erythema and firmness. She states that it grew to a pustule 2 days ago it has become more tender and painful. Nothing has improved the lesion. It is approximately dime size in diameter by the patient's account. She denies fevers, chills, nausea, vomiting. She continues to have some light lochia. She denies history of herpetic lesions.  OB History    Grav Para Term Preterm Abortions TAB SAB Ect Mult Living   3 1 1  2  2   1       Past Medical History  Diagnosis Date  . No pertinent past medical history     Past Surgical History  Procedure Date  . No past surgeries     Family History  Problem Relation Age of Onset  . Alcohol abuse Mother   . Depression Mother   . Drug abuse Mother   . Heart disease Mother     mother had a closed valve and had heart surgery  . Alcohol abuse Father   . Drug abuse Father     History  Substance Use Topics  . Smoking status: Former Smoker -- 0.0 packs/day    Types: Cigarettes  . Smokeless tobacco: Never Used  . Alcohol Use: No    Allergies: No Known Allergies  Prescriptions prior to admission  Medication Sig Dispense Refill  . cyclobenzaprine (FLEXERIL) 10 MG tablet Take 10 mg by mouth 3 (three) times daily as needed. Muscle spasm       . prenatal vitamin w/FE, FA (PRENATAL 1 + 1) 27-1 MG TABS Take 1 tablet by mouth daily.  30 each  1  . prenatal vitamin w/FE, FA (PRENATAL 1 + 1) 27-1 MG TABS Take 1 tablet by mouth daily.  30 each  12  . zolpidem (AMBIEN) 10 MG tablet Take 1 tablet (10 mg total) by mouth at bedtime as needed for sleep.  20 tablet  0    Review of Systems  All other systems reviewed and are negative.   Physical Exam   Blood pressure 117/84, pulse 104, temperature 98.3 F (36.8 C), temperature source  Oral, resp. rate 20, height 5\' 5"  (1.651 m), weight 54.885 kg (121 lb), not currently breastfeeding.  Physical Exam  Constitutional: She appears well-developed and well-nourished.  Skin: Skin is warm and dry.       Approximately 0.5 cm abscess with a 3 cm area of induration and erythema surrounding the abscess.    MAU Course  Procedures   Assessment and Plan  #1 right thigh abscess and cellulitis  The area was already opened. The abscess was drained and cultures were taken. Due to the area of induration and surrounding cellulitis also the patient on Bactrim for coverage for MRSA.  Taylr Meuth JEHIEL 09/04/2011, 5:50 PM

## 2011-09-04 NOTE — Progress Notes (Signed)
Pt states, " I delivered on 10/25, vaginal del, and I've had sores with blisters on the right side of my thigh for the past 4 days. It is real painful. The nurse said it looked like herpes" .

## 2011-09-07 LAB — WOUND CULTURE

## 2011-09-09 ENCOUNTER — Ambulatory Visit: Payer: Medicaid Other | Admitting: Physician Assistant

## 2011-09-10 ENCOUNTER — Encounter: Payer: Self-pay | Admitting: Advanced Practice Midwife

## 2011-09-10 ENCOUNTER — Telehealth: Payer: Self-pay | Admitting: *Deleted

## 2011-09-10 DIAGNOSIS — L0390 Cellulitis, unspecified: Secondary | ICD-10-CM | POA: Insufficient documentation

## 2011-09-10 DIAGNOSIS — B9562 Methicillin resistant Staphylococcus aureus infection as the cause of diseases classified elsewhere: Secondary | ICD-10-CM

## 2011-09-10 NOTE — Telephone Encounter (Signed)
Pt left message requesting results 

## 2011-09-11 NOTE — Telephone Encounter (Signed)
Pt called back for results and I discussed the + culture for MRSA. Pt was not familiar with this so I explained in detail. She stated that she had only taken 2 doses of medication and because she was feeling better, had stopped. I informed pt why it is important to finish all of the medicine and instructed pt to take the remaining pills as prescribed. I also discussed hand washing with the patient and why this is important to decrease the chance for transmission. Pt voiced understanding

## 2011-09-11 NOTE — Telephone Encounter (Signed)
Called pt after review of record and test results.  I spoke with a female who stated that Brandy Kidd would return later this afternoon. I left message that I will call her back on Monday.  Pt can be informed that her wound culture is positive for MRSA and she was given the appropriate antibiotic. Confirm that pt is taking the medicine and instruct pt on proper handwashing and its importance.

## 2011-09-23 ENCOUNTER — Ambulatory Visit: Payer: Medicaid Other | Admitting: Physician Assistant

## 2011-09-24 ENCOUNTER — Ambulatory Visit: Payer: Medicaid Other | Admitting: Advanced Practice Midwife

## 2011-09-24 ENCOUNTER — Encounter: Payer: Self-pay | Admitting: Obstetrics & Gynecology

## 2011-09-24 ENCOUNTER — Ambulatory Visit: Payer: Medicaid Other | Admitting: Physician Assistant

## 2011-10-28 ENCOUNTER — Ambulatory Visit: Payer: Medicaid Other | Admitting: Obstetrics & Gynecology

## 2011-10-30 ENCOUNTER — Ambulatory Visit (INDEPENDENT_AMBULATORY_CARE_PROVIDER_SITE_OTHER): Payer: Self-pay | Admitting: Obstetrics & Gynecology

## 2011-10-30 ENCOUNTER — Encounter: Payer: Self-pay | Admitting: Obstetrics & Gynecology

## 2011-10-30 DIAGNOSIS — Z3009 Encounter for other general counseling and advice on contraception: Secondary | ICD-10-CM

## 2011-10-30 DIAGNOSIS — O98319 Other infections with a predominantly sexual mode of transmission complicating pregnancy, unspecified trimester: Secondary | ICD-10-CM

## 2011-10-30 LAB — POCT PREGNANCY, URINE: Preg Test, Ur: NEGATIVE

## 2011-10-30 MED ORDER — NORGESTIM-ETH ESTRAD TRIPHASIC 0.18/0.215/0.25 MG-25 MCG PO TABS: 1.0000 | ORAL_TABLET | Freq: Every day | ORAL | Status: DC

## 2011-10-30 NOTE — Patient Instructions (Signed)
Oral Contraceptives Oral contraceptives (OCs) are medicines taken to prevent pregnancy. They are the most widely used method of birth control. OCs work by preventing the ovaries from releasing eggs. The OC hormones also cause the mucus on the cervix to thicken, preventing the sperm from entering the uterus. They also cause the lining of the uterus to become thin, not allowing a fertilized egg to attach to the inside of the uterus. OCs have a failure rate of less than 1%, when taken exactly as prescribed. THERE ARE 2 TYPES OF OC  OC that contains a mix of estrogen and progesterone hormones is the most common OC used. It is taken for 21 days, followed by 7 days of not taking the OC hormones. It can be packaged as 28 pills, with the last 7 pills being inactive. You take a pill every day. This way you do not need to remember when to restart taking the active pills. Most women will begin their menstrual period 2 to 3 days after taking the hormone pill. The menstrual period is usually lighter and shorter. This combination OC should not be taken if you are breast-feeding.   The progesterone only (minipill) OC does not contain estrogen. It is taken every day, continuously. You may have only spotting for a period, or no period at all. The progesterone only OC can be taken if you are breast-feeding your baby.  OCs come in:  Packs of 21 pills, with no pills to take for 7 days after the last pill.   Packs of 28 pills, with a pill to take every day. The last 7 pills are without hormones.   Packs of 91 pills (continuous or extended use), with a pill to take every day. The first 84 pills contain the hormones, and the last 7 pills do not. That is when you will have your menstrual period. You will not have a menstrual period during the time you are taking the first 84 pills.  HOW TO TAKE OC Your caregiver may advise you on how to start taking the first cycle of OCs. Otherwise, you can:  Start on day 1 or day 5 of  your menstrual period, taking the first pack of the OC. You will not need any backup contraceptive protection with this start time.   Start on the first Sunday after your menstrual period, day 7 of your menstrual period, or the day you get your prescription. In these cases, you will need backup contraceptive protection for the first cycle.  No matter which day you start the OC, you will always start a new pack on that same day of the week. It is a good idea to have an extra pack of OCs and a backup contraceptive method available, in case you miss some pills or lose your OC pack. COMMON REASONS FOR FAILURE   Forgetting to take the pill at the same time every day.   Poor absorption of the pill from the stomach into the bloodstream. This can be caused by diarrhea, vomiting, and the use of some medicines that kill germs (antibiotics).   Stomach or intestinal disease.   Taking OCs with other medicines that may make them less effective (carbamazepine, phenytoin, phenobarbital, rifampin).   Using OCs that have passed their expiration dates.   Forgetting to restart the pills on day 7, when using the packs of 21 pills.  If you forget to take 1 pill, take it as soon as you remember, and take the next pill at the   regular time. If you miss 2 or more pills, use backup birth control until your next menstrual period starts. Also, you may have vaginal spotting or bleeding when you miss 2 or more OC pills. If you use the pack of 28 pills or 91 pills, and you miss 1 of the last 7 pills (pills with no hormones), it will not matter. Just throw away the rest of the non-hormone pills and start a new 28 or 91 pill pack. COMMON USES OF OC  Decreasing premenstrual problems (symptoms).   Treating menstrual period cramps.   Avoiding becoming pregnant.   Regulating the menstrual cycle.   Treating acne.   Decreasing the heavy menstrual flow.   Treating dysfunctional (abnormal) uterine bleeding.   Treating  chronic pelvic pain.   Treating polycystic ovary syndrome (ovary does not ovulate and produces tiny cysts).   Treating endometriosis (uterus lining growing in the pelvis, tubes, and ovaries).   Can be used for emergency contraception.  OCs DO NOT prevent sexually transmitted diseases (STDs). Safer sex practices, such as using condoms along with the pill, can help prevent STDs.  BENEFITS  OC reduces the risk of:   Cancer of the ovary and uterus.   Ovarian cysts.   Pelvic infection.   Symptoms of polycystic ovary syndrome.   Loss of bone (osteoporosis).   Noncancerous (benign) breast disease (fibrocystic breast changes).   Lack of red blood cells (anemia) from heavy or long menstrual periods.   Pregnancy occurring outside the uterus (tubal pregnancy).   Acne.   Slows down the flow of heavy menstrual periods.   Sometimes helps control premenstrual syndrome (PMS).   Stops menstrual cramps and pain.   Controls irregular menstrual periods.   Can be used as emergency contraception.  YOU SHOULD NOT TAKE THE PILL IF YOU:  Are pregnant, or are trying to get pregnant.   Have unexplained or abnormal vaginal bleeding.   Have a history of liver disease, stroke, or heart attack.   Smoke.   Have a history of blood clots, cancer, or heart problems.   Have gallbladder disease.   Have breast cancer or suspect breast cancer.   Have or suspect pelvic cancer.   Have high blood pressure.   Have high cholesterol or high triglycerides.   Have mental depression.   Are breast-feeding, except for the progesterone only OC, with approval of your caregiver.   Have diabetes with kidney, eye, or other blood vessel complications. Or if you have diabetes for 20 years or more.   Have heart valve disease.   Have migraine headaches. They may get worse.  Before taking the pill, a woman will have a physical exam and Pap test. Your caregiver may order blood tests to check blood sugar and  cholesterol levels, and other blood tests that may be necessary. SIDE EFFECTS OF THE PILL MAY INCLUDE:  Breast tenderness, pain and discharge.   Change in sex drive (increased or decreased libido).   Depression.   Being tired often.   Headaches.   Anxiety.   Irregular spotting or vaginal bleeding for a couple of months.   Leg pain.   Cramps, or swelling of your limbs (extremities).   Mood swings.   Weight loss or weight gain.   Feeling sick to your stomach (nausea).   Change in appetite (hunger).   Loss of hair.   Yeast or fungus vaginal infection.   Nervousness.   Rash.   Acne.   No menstrual period (amenorrhea).  When   starting an OC, it is usually best to allow 2-3 months, if possible, for the body to adjust (before stopping because of side effects). This allows for adjustment to the changes in hormone levels. If a woman continues to have side effects, it may be possible to change to a different OC. It is important to discuss side effects with your caregiver. Often, changing to a different pill causes the side effects to subside. RISKS AND COMPLICATIONS   Blood clots of the leg, heart, lung, or brain.   High blood pressure.   Gallbladder disease.   Liver tumors.   Brain bleeding (hemorrhage).   Slight risk of breast cancer.  HOME CARE INSTRUCTIONS   Do not smoke.   Only take over-the-counter or prescription medicines for pain, discomfort, fever, or breast tenderness as directed by your caregiver.   Always use a condom to protect against sexually transmitted disease. OCs do not protect against STDs.   Keep a calendar, marking your menstrual period days.  Recommendations, types, and dosages of OC use change continually. Discuss your choices with your caregiver, and decide what is best for you. There are always exceptions to guidelines. You should always read the information that comes with the OC, and check whether there are any new recommendations or  guidelines. SEEK MEDICAL CARE IF:   You develop nausea and vomiting from the OC.   You have abnormal vaginal discharge.   You need treatment for headaches.   You develop a rash.   You miss your menstrual period.   You develop abnormal vaginal bleeding.   You are losing your hair.   You need treatment for mood swings or depression.   You get dizzy when taking the OC.   You develop acne from taking the OC.  SEEK IMMEDIATE MEDICAL CARE IF:   You develop leg pain.   You develop chest pain.   You develop shortness of breath.   You develop abdominal pain.   You have an uncontrolled headache.   You develop numbness or slurred speech.   You develop visual problems (loss of vision, double, or blurry vision).   You develop heavy vaginal bleeding.  If you are taking the pill, STOP RIGHT AWAY and CALL YOUR CAREGIVER IMMEDIATELY if the following occur:  You develop chest pain and shortness of breath.   You develop pain, redness, and swelling in the legs.   You develop severe headaches, visual changes, or belly (abdominal) pain.   You develop severe depression.   You become pregnant.  Document Released: 01/02/2003 Document Revised: 11/14/2010 Document Reviewed: 10/24/2009 [employer] Patient Information 2012 [employer]. 

## 2011-10-30 NOTE — Progress Notes (Signed)
  Subjective:    Patient ID: Brandy Kidd, female    DOB: 09-Jan-1991, 21 y.o.   MRN: [id number]  HPIPatient's last menstrual period was 09/24/2011.[id number] S/P SVD 08/19/11, no complication. She may have HPV symptoms.Slight discharge.  Wants to try OCP. Past Medical History  Diagnosis Date  . No pertinent past medical history   . HPV in female    Scheduled Meds:   Continuous Infusions:   PRN Meds:.   History   Social History  . Marital Status: Single    Spouse Name: N/A    Number of Children: N/A  . Years of Education: N/A   Occupational History  . Not on file.   Social History Main Topics  . Smoking status: Former Smoker -- 0.0 packs/day    Types: Cigarettes  . Smokeless tobacco: Never Used  . Alcohol Use: No  . Drug Use: No  . Sexually Active: Yes    Birth Control/ Protection: None   Other Topics Concern  . Not on file   Social History Narrative  . No narrative on file   Brandy Kidd does not currently have medications on file. No Known Allergies     Review of Systems     Objective:   Physical Exam Filed Vitals:   10/30/11 0931  BP: 116/75  Pulse: 74  Temp: 96.6 F (35.9 C)   NAD, pleasant Abd not tender no mass Pelvic:EBUS mild HPV changes at introitus, vaginal discharge wet prep done, cx normal Uterus nl, no mass       Assessment & Plan:  Mild HPV sx, expt management OCP Rx   [facility] RTC yrly Johnta Couts 10/30/11

## 2011-10-31 LAB — WET PREP, GENITAL
Trich, Wet Prep: NONE SEEN
Yeast Wet Prep HPF POC: NONE SEEN

## 2012-02-09 ENCOUNTER — Inpatient Hospital Stay (HOSPITAL_COMMUNITY)
Admission: AD | Admit: 2012-02-09 | Discharge: 2012-02-09 | Disposition: A | Discharge status: Home / Self Care | Payer: Self-pay | Source: Ambulatory Visit | Attending: Obstetrics and Gynecology | Admitting: Obstetrics and Gynecology

## 2012-02-09 ENCOUNTER — Encounter (HOSPITAL_COMMUNITY): Payer: Self-pay

## 2012-02-09 DIAGNOSIS — Z711 Person with feared health complaint in whom no diagnosis is made: Secondary | ICD-10-CM | POA: Insufficient documentation

## 2012-02-09 NOTE — Discharge Instructions (Signed)
RETURN AS NEEDED 

## 2012-02-09 NOTE — MAU Provider Note (Signed)
Brandy Kidd is a 21 y.o. female who presents to MAU for questions regarding birth control. She used a birth control suppository and was concerned that it did not come out.   VS reviewed and are normal.  Explained to the patient that the way the suppository works is that it dissolves.   Patient does not want further evaluation.  Assessment: Use of suppository contraception questions  Plan:  Explained use to patient

## 2012-02-09 NOTE — MAU Note (Signed)
Patient states she used a vaginal supp for birth control and is concerned that it did not come out. Explained to patient that the supp melts and does not come out.

## 2012-02-15 NOTE — MAU Provider Note (Signed)
Agree with above note.  Brandy Kidd 02/15/2012 9:41 AM

## 2012-10-15 ENCOUNTER — Emergency Department (HOSPITAL_COMMUNITY)
Admission: EM | Admit: 2012-10-15 | Discharge: 2012-10-15 | Disposition: A | Discharge status: Home / Self Care | Payer: Self-pay | Attending: Emergency Medicine | Admitting: Emergency Medicine

## 2012-10-15 ENCOUNTER — Encounter (HOSPITAL_COMMUNITY): Payer: Self-pay | Admitting: *Deleted

## 2012-10-15 DIAGNOSIS — F419 Anxiety disorder, unspecified: Secondary | ICD-10-CM

## 2012-10-15 DIAGNOSIS — Z87891 Personal history of nicotine dependence: Secondary | ICD-10-CM | POA: Insufficient documentation

## 2012-10-15 DIAGNOSIS — F411 Generalized anxiety disorder: Secondary | ICD-10-CM | POA: Insufficient documentation

## 2012-10-15 MED ORDER — LORAZEPAM 1 MG PO TABS
1.0000 mg | ORAL_TABLET | Freq: Once | ORAL | Status: AC
  Administered 2012-10-15: 1 mg via ORAL
  Filled 2012-10-15: qty 1

## 2012-10-15 NOTE — ED Notes (Signed)
Pt continues to yell and scream "I can't breathe and I am dying"; Pt reassured that O2 sats 100% and VS stable; pt refuses to listen and states "The machines are lying"; pt uncooperative and remains anxious.
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
## 2012-10-15 NOTE — ED Notes (Signed)
Pt and RN had a conversation about upcoming court case and that this makes her anxious and upset. Pt states "I feel like there is nothing I can do"; pt states "I am about to loose my daughter forever"; RN allowed pt to vent her feelings about upcoming court case and how that affects her everyday attitude. Pt calm and cooperative after conversation.
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
## 2012-10-15 NOTE — ED Notes (Signed)
Bed:WA09<BR> Expected date:<BR> Expected time:<BR> Means of arrival:<BR> Comments:<BR> EMS

## 2012-11-28 ENCOUNTER — Inpatient Hospital Stay (HOSPITAL_COMMUNITY)
Admission: AD | Admit: 2012-11-28 | Discharge: 2012-11-28 | Disposition: A | Discharge status: Home / Self Care | Payer: Self-pay | Source: Ambulatory Visit | Attending: Obstetrics & Gynecology | Admitting: Obstetrics & Gynecology

## 2012-11-28 ENCOUNTER — Encounter (HOSPITAL_COMMUNITY): Payer: Self-pay

## 2012-11-28 ENCOUNTER — Inpatient Hospital Stay (HOSPITAL_COMMUNITY): Payer: Self-pay

## 2012-11-28 DIAGNOSIS — R109 Unspecified abdominal pain: Secondary | ICD-10-CM | POA: Insufficient documentation

## 2012-11-28 DIAGNOSIS — A499 Bacterial infection, unspecified: Secondary | ICD-10-CM | POA: Insufficient documentation

## 2012-11-28 DIAGNOSIS — N83209 Unspecified ovarian cyst, unspecified side: Secondary | ICD-10-CM | POA: Insufficient documentation

## 2012-11-28 DIAGNOSIS — B9689 Other specified bacterial agents as the cause of diseases classified elsewhere: Secondary | ICD-10-CM | POA: Insufficient documentation

## 2012-11-28 DIAGNOSIS — N760 Acute vaginitis: Secondary | ICD-10-CM | POA: Insufficient documentation

## 2012-11-28 DIAGNOSIS — N83201 Unspecified ovarian cyst, right side: Secondary | ICD-10-CM

## 2012-11-28 DIAGNOSIS — N949 Unspecified condition associated with female genital organs and menstrual cycle: Secondary | ICD-10-CM | POA: Insufficient documentation

## 2012-11-28 HISTORY — DX: Reserved for concepts with insufficient information to code with codable children: IMO0002

## 2012-11-28 HISTORY — DX: Unspecified abnormal cytological findings in specimens from cervix uteri: R87.619

## 2012-11-28 HISTORY — DX: Chlamydial infection, unspecified: A74.9

## 2012-11-28 HISTORY — DX: Gonococcal infection, unspecified: A54.9

## 2012-11-28 LAB — COMPREHENSIVE METABOLIC PANEL
AST: 17 U/L (ref 0–37)
Albumin: 4.5 g/dL (ref 3.5–5.2)
Alkaline Phosphatase: 52 U/L (ref 39–117)
BUN: 11 mg/dL (ref 6–23)
Potassium: 3.6 mEq/L (ref 3.5–5.1)
Total Protein: 7.7 g/dL (ref 6.0–8.3)

## 2012-11-28 LAB — URINALYSIS, ROUTINE W REFLEX MICROSCOPIC
Bilirubin Urine: NEGATIVE
Glucose, UA: NEGATIVE mg/dL
Hgb urine dipstick: NEGATIVE
Ketones, ur: NEGATIVE mg/dL
Leukocytes, UA: NEGATIVE
Protein, ur: NEGATIVE mg/dL
pH: 6 (ref 5.0–8.0)

## 2012-11-28 LAB — WET PREP, GENITAL
Trich, Wet Prep: NONE SEEN
Yeast Wet Prep HPF POC: NONE SEEN

## 2012-11-28 LAB — CBC
HCT: 40.4 % (ref 36.0–46.0)
MCHC: 34.2 g/dL (ref 30.0–36.0)
Platelets: 273 10*3/uL (ref 150–400)
RDW: 12.1 % (ref 11.5–15.5)

## 2012-11-28 MED ORDER — IBUPROFEN 800 MG PO TABS: 800.0000 mg | ORAL_TABLET | Freq: Three times a day (TID) | ORAL | Status: DC | PRN

## 2012-11-28 MED ORDER — IBUPROFEN 800 MG PO TABS
800.0000 mg | ORAL_TABLET | Freq: Once | ORAL | Status: AC
  Administered 2012-11-28: 800 mg via ORAL
  Filled 2012-11-28: qty 1

## 2012-11-28 MED ORDER — METRONIDAZOLE 500 MG PO TABS: 500.0000 mg | ORAL_TABLET | Freq: Two times a day (BID) | ORAL | Status: DC

## 2012-11-28 NOTE — MAU Note (Signed)
Patient states she has been having side pain that radiates to the right flank. Has been having a yellow vaginal discharge with a slight odor.

## 2012-11-28 NOTE — MAU Provider Note (Signed)
History     CSN: [id number]  Arrival date and time: 11/28/12 1519   None     Chief Complaint  Patient presents with  . Abdominal Pain  . Flank Pain  . Vaginal Discharge   HPI 22 y.o. [id number] with right side/flank/pelvic pain x 2 weeks, getting worse, not taking any medicine. Yellow vaginal discharge, + odor. No fever, chills, n/v, constipation, diarrhea. H/O PID requiring IV abx and 2 week hospital admission last may. States that her "enzymes were elevated" and that "either my kidney or liver was really enlarged too".    Past Medical History  Diagnosis Date  . No pertinent past medical history   . HPV in female   . Chlamydia   . Gonorrhea   . Abnormal Pap smear     Past Surgical History  Procedure Date  . No past surgeries     Family History  Problem Relation Age of Onset  . Alcohol abuse Mother   . Depression Mother   . Drug abuse Mother   . Heart disease Mother     mother had a closed valve and had heart surgery  . Alcohol abuse Father   . Drug abuse Father     History  Substance Use Topics  . Smoking status: Former Smoker -- 0.0 packs/day    Types: Cigarettes  . Smokeless tobacco: Never Used  . Alcohol Use: Yes     Comment: 1-2 glasses of wine per week    Allergies: No Known Allergies  No prescriptions prior to admission    Review of Systems  Constitutional: Negative.   Respiratory: Negative.   Cardiovascular: Negative.   Gastrointestinal: Positive for abdominal pain. Negative for nausea, vomiting, diarrhea and constipation.  Genitourinary: Positive for flank pain. Negative for dysuria, urgency, frequency and hematuria.       Negative for vaginal bleeding, dyspareunia, + vaginal bleeding   Musculoskeletal: Negative.   Neurological: Negative.   Psychiatric/Behavioral: Negative.    Physical Exam   Blood pressure 108/64, pulse 83, temperature 98.3 F (36.8 C), temperature source Oral, resp. rate 18, height 5' 5.5" (1.664 m), weight 128 lb  (58.06 kg), last menstrual period 11/24/2012, SpO2 99.00%.  Physical Exam  Nursing note and vitals reviewed. Constitutional: She is oriented to person, place, and time. She appears well-developed and well-nourished. No distress.  Cardiovascular: Normal rate.   Respiratory: Effort normal.  GI: Soft. She exhibits no mass. There is no tenderness. There is no rebound and no guarding.  Genitourinary: There is no rash, tenderness or lesion on the right labia. There is no rash, tenderness or lesion on the left labia. Uterus is tender. Cervix exhibits motion tenderness. Cervix exhibits no discharge and no friability. Right adnexum displays tenderness. Right adnexum displays no mass and no fullness. Left adnexum displays no mass, no tenderness and no fullness. No bleeding around the vagina. Vaginal discharge (white, sl odor) found.  Musculoskeletal: Normal range of motion.  Neurological: She is alert and oriented to person, place, and time.  Skin: Skin is warm and dry.  Psychiatric: She has a normal mood and affect.    MAU Course  Procedures Results for orders placed during the hospital encounter of 11/28/12 (from the past 24 hour(s))  URINALYSIS, ROUTINE W REFLEX MICROSCOPIC     Status: Normal   Collection Time   11/28/12  3:35 PM      Component Value Range   Color, Urine YELLOW  YELLOW   APPearance CLEAR  CLEAR   Specific  Gravity, Urine 1.015  1.005 - 1.030   pH 6.0  5.0 - 8.0   Glucose, UA NEGATIVE  NEGATIVE mg/dL   Hgb urine dipstick NEGATIVE  NEGATIVE   Bilirubin Urine NEGATIVE  NEGATIVE   Ketones, ur NEGATIVE  NEGATIVE mg/dL   Protein, ur NEGATIVE  NEGATIVE mg/dL   Urobilinogen, UA 0.2  0.0 - 1.0 mg/dL   Nitrite NEGATIVE  NEGATIVE   Leukocytes, UA NEGATIVE  NEGATIVE  POCT PREGNANCY, URINE     Status: Normal   Collection Time   11/28/12  4:04 PM      Component Value Range   Preg Test, Ur NEGATIVE  NEGATIVE  CBC     Status: Normal   Collection Time   11/28/12  5:21 PM      Component  Value Range   WBC 8.8  4.0 - 10.5 K/uL   RBC 4.46  3.87 - 5.11 MIL/uL   Hemoglobin 13.8  12.0 - 15.0 g/dL   HCT 78.2  95.6 - 21.3 %   MCV 90.6  78.0 - 100.0 fL   MCH 30.9  26.0 - 34.0 pg   MCHC 34.2  30.0 - 36.0 g/dL   RDW 08.6  57.8 - 46.9 %   Platelets 273  150 - 400 K/uL  COMPREHENSIVE METABOLIC PANEL     Status: Abnormal   Collection Time   11/28/12  5:21 PM      Component Value Range   Sodium 137  135 - 145 mEq/L   Potassium 3.6  3.5 - 5.1 mEq/L   Chloride 97  96 - 112 mEq/L   CO2 27  19 - 32 mEq/L   Glucose, Bld 112 (*) 70 - 99 mg/dL   BUN 11  6 - 23 mg/dL   Creatinine, Ser 6.29  0.50 - 1.10 mg/dL   Calcium 9.8  8.4 - 52.8 mg/dL   Total Protein 7.7  6.0 - 8.3 g/dL   Albumin 4.5  3.5 - 5.2 g/dL   AST 17  0 - 37 U/L   ALT 12  0 - 35 U/L   Alkaline Phosphatase 52  39 - 117 U/L   Total Bilirubin 0.6  0.3 - 1.2 mg/dL   GFR calc non Af Amer >90  >90 mL/min   GFR calc Af Amer >90  >90 mL/min  WET PREP, GENITAL     Status: Abnormal   Collection Time   11/28/12  5:25 PM      Component Value Range   Yeast Wet Prep HPF POC NONE SEEN  NONE SEEN   Trich, Wet Prep NONE SEEN  NONE SEEN   Clue Cells Wet Prep HPF POC MODERATE (*) NONE SEEN   WBC, Wet Prep HPF POC FEW (*) NONE SEEN   US Transvaginal Non-ob  11/28/2012  *RADIOLOGY REPORT*  Clinical Data: Pelvic pain.  History of PID.  TRANSABDOMINAL AND TRANSVAGINAL ULTRASOUND OF PELVIS Technique:  Both transabdominal and transvaginal ultrasound examinations of the pelvis were performed. Transabdominal technique was performed for global imaging of the pelvis including uterus, ovaries, adnexal regions, and pelvic cul-de-sac.  It was necessary to proceed with endovaginal exam following the transabdominal exam to visualize the uterus, endometrium and ovaries.  Comparison:  Ovaries sonogram 06/16/2011.  Findings:  Uterus: 7.5 x 3.9 x 4.6 cm without dominant mass.  Endometrium: 5.6 mm.  Trilaminar appearance.  Right ovary:  3.9 x 2.5 x 2.6 cm.  Slightly complex cyst has an appearance of involuting cyst.  Left ovary:  4.3 x 2.1 x 2.4 cm without dominant mass.  Other findings: No free fluid  IMPRESSION: Slightly complex right ovarian cyst has an appearance of involuting cyst.   Original Report Authenticated By: Lacy Duverney, M.D.    US Pelvis Complete  11/28/2012  *RADIOLOGY REPORT*  Clinical Data: Pelvic pain.  History of PID.  TRANSABDOMINAL AND TRANSVAGINAL ULTRASOUND OF PELVIS Technique:  Both transabdominal and transvaginal ultrasound examinations of the pelvis were performed. Transabdominal technique was performed for global imaging of the pelvis including uterus, ovaries, adnexal regions, and pelvic cul-de-sac.  It was necessary to proceed with endovaginal exam following the transabdominal exam to visualize the uterus, endometrium and ovaries.  Comparison:  Ovaries sonogram 06/16/2011.  Findings:  Uterus: 7.5 x 3.9 x 4.6 cm without dominant mass.  Endometrium: 5.6 mm.  Trilaminar appearance.  Right ovary:  3.9 x 2.5 x 2.6 cm. Slightly complex cyst has an appearance of involuting cyst.  Left ovary: 4.3 x 2.1 x 2.4 cm without dominant mass.  Other findings: No free fluid  IMPRESSION: Slightly complex right ovarian cyst has an appearance of involuting cyst.   Original Report Authenticated By: Lacy Duverney, M.D.      Assessment and Plan   1. Right ovarian cyst   2. BV (bacterial vaginosis)       Medication List     As of 11/28/2012 11:42 PM    START taking these medications         ibuprofen 800 MG tablet   Commonly known as: ADVIL,MOTRIN   Take 1 tablet (800 mg total) by mouth every 8 (eight) hours as needed for pain.      metroNIDAZOLE 500 MG tablet   Commonly known as: FLAGYL   Take 1 tablet (500 mg total) by mouth 2 (two) times daily.      CONTINUE taking these medications         multivitamin with minerals tablet          Where to get your medications    These are the prescriptions that you need to pick up. We sent  them to a specific pharmacy, so you will need to go there to get them.   [id number] - [place], [place] - [address] AT [facility] OF [address]    [address] [place] [address]    Phone: [phone]    Hours: 24-hours        ibuprofen 800 MG tablet   metroNIDAZOLE 500 MG tablet            Follow-up Information    Follow up with [facility]. (As needed if symptoms worsen)    Contact information:   [address] [place] [address] [phone]           Cordera Stineman 11/28/2012, 11:42 PM

## 2012-11-29 LAB — GC/CHLAMYDIA PROBE AMP: GC Probe RNA: NEGATIVE

## 2012-11-29 NOTE — MAU Provider Note (Signed)
Attestation of Attending Supervision of Advanced Practitioner (PA/CNM/NP): Evaluation and management procedures were performed by the Advanced Practitioner under my supervision and collaboration.  I have reviewed the Advanced Practitioner's note and chart, and I agree with the management and plan.  Antia Rahal, MD, FACOG Attending Obstetrician & Gynecologist Faculty Practice, [facility]  

## 2013-03-15 ENCOUNTER — Emergency Department (HOSPITAL_COMMUNITY)
Admission: EM | Admit: 2013-03-15 | Discharge: 2013-03-15 | Disposition: A | Discharge status: Home / Self Care | Payer: Self-pay | Attending: Emergency Medicine | Admitting: Emergency Medicine

## 2013-03-15 ENCOUNTER — Emergency Department (HOSPITAL_COMMUNITY): Payer: Self-pay

## 2013-03-15 ENCOUNTER — Encounter (HOSPITAL_COMMUNITY): Payer: Self-pay | Admitting: *Deleted

## 2013-03-15 DIAGNOSIS — R002 Palpitations: Secondary | ICD-10-CM | POA: Insufficient documentation

## 2013-03-15 DIAGNOSIS — F17200 Nicotine dependence, unspecified, uncomplicated: Secondary | ICD-10-CM | POA: Insufficient documentation

## 2013-03-15 DIAGNOSIS — Z3202 Encounter for pregnancy test, result negative: Secondary | ICD-10-CM | POA: Insufficient documentation

## 2013-03-15 LAB — CBC WITH DIFFERENTIAL/PLATELET
Basophils Relative: 0 % (ref 0–1)
Eosinophils Absolute: 0.1 10*3/uL (ref 0.0–0.7)
MCH: 30.9 pg (ref 26.0–34.0)
MCHC: 34.5 g/dL (ref 30.0–36.0)
Monocytes Relative: 6 % (ref 3–12)
Neutrophils Relative %: 59 % (ref 43–77)
Platelets: 291 10*3/uL (ref 150–400)
RDW: 12 % (ref 11.5–15.5)

## 2013-03-15 LAB — URINALYSIS, ROUTINE W REFLEX MICROSCOPIC
Hgb urine dipstick: NEGATIVE
Protein, ur: NEGATIVE mg/dL
Urobilinogen, UA: 0.2 mg/dL (ref 0.0–1.0)

## 2013-03-15 LAB — URINE MICROSCOPIC-ADD ON

## 2013-03-15 LAB — POCT I-STAT, CHEM 8
BUN: 10 mg/dL (ref 6–23)
Calcium, Ion: 1.21 mmol/L (ref 1.12–1.23)
TCO2: 24 mmol/L (ref 0–100)

## 2013-03-15 LAB — POCT PREGNANCY, URINE: Preg Test, Ur: NEGATIVE

## 2013-03-15 NOTE — ED Notes (Signed)
[id number]> Expected date:<BR> Expected time:<BR> Means of arrival:<BR> Comments:<BR> EMS/21 yo female with anxiety and near syncopal-IV established

## 2013-03-15 NOTE — ED Provider Notes (Signed)
History     CSN: [id number]  Arrival date & time 03/15/13  0554   None     No chief complaint on file.   (Consider location/radiation/quality/duration/timing/severity/associated sxs/prior treatment) HPI Comments: 22 y.o. Female with no prior medical hx presents complaining of sudden onset mild dizziness and heart palpations since this morning. Pt had awakened and was getting ready for the day when the episode began. Pt denies fever, nausea, vomiting, dyspnea, SOB, chest pain, headache, visual disturbances, photophobia. Pt states sx have since resolved and is not in any pain or distress. Pt states this has been happening on and off for several weeks. It is not associated with exertion. Pt does have insurance, but does not have a primary care doctor.    History reviewed. No pertinent past medical history.  History reviewed. No pertinent past surgical history.  No family history on file.  History  Substance Use Topics  . Smoking status: Smoker, Current Status Unknown  . Smokeless tobacco: Not on file  . Alcohol Use: No    OB History   Grav Para Term Preterm Abortions TAB SAB Ect Mult Living                  Review of Systems  Constitutional: Negative for fever and diaphoresis.  HENT: Negative for neck pain and neck stiffness.   Eyes: Negative for visual disturbance.  Respiratory: Negative for apnea, chest tightness and shortness of breath.   Cardiovascular: Positive for palpitations. Negative for chest pain.  Gastrointestinal: Negative for nausea, vomiting, diarrhea and constipation.  Genitourinary: Negative for dysuria.  Musculoskeletal: Negative for gait problem.  Skin: Negative for rash.  Neurological: Positive for dizziness. Negative for syncope, weakness, light-headedness, numbness and headaches.    Allergies  Review of patient's allergies indicates no known allergies.  Home Medications  No current outpatient prescriptions on file.  BP 108/69  Pulse 81   Temp(Src) 98 F (36.7 C) (Oral)  Resp 20  SpO2 100%  LMP 02/17/2013  Physical Exam  Nursing note and vitals reviewed. Constitutional: She is oriented to person, place, and time. She appears well-developed and well-nourished. No distress.  HENT:  Head: Normocephalic and atraumatic.  Eyes: Conjunctivae and EOM are normal.  Neck: Normal range of motion. Neck supple.  No meningeal signs  Cardiovascular: Normal rate, regular rhythm and normal heart sounds.  Exam reveals no gallop and no friction rub.   No murmur heard. Pulmonary/Chest: Effort normal and breath sounds normal. No respiratory distress. She has no wheezes. She has no rales. She exhibits no tenderness.  Abdominal: Soft. Bowel sounds are normal. She exhibits no distension. There is no tenderness. There is no rebound and no guarding.  Musculoskeletal: Normal range of motion. She exhibits no edema and no tenderness.  Normal strength in upper and lower extremities bilaterally including dorsiflexion and plantar flexion, strong and equal grip strength  Neurological: She is alert and oriented to person, place, and time. No cranial nerve deficit.  Speech is clear and goal oriented, follows commands Sensation normal to light touch  Moves extremities without ataxia, coordination intact Normal gait and balance  Skin: Skin is warm and dry. She is not diaphoretic. No erythema.  Psychiatric: She has a normal mood and affect.    ED Course  Procedures (including critical care time)  Labs Reviewed  POCT I-STAT, CHEM 8 - Abnormal; Notable for the following:    Glucose, Bld 100 (*)    All other components within normal limits  CBC WITH  DIFFERENTIAL  URINALYSIS, ROUTINE W REFLEX MICROSCOPIC  POCT PREGNANCY, URINE    Date: 03/15/2013  Rate: 68  Rhythm: normal sinus rhythm  QRS Axis: normal  Intervals: PR prolonged  ST/T Wave abnormalities: normal  Conduction Disutrbances:none  Narrative Interpretation: normal EKG  Old EKG Reviewed:  none available   Dg Chest 2 View  03/15/2013   *RADIOLOGY REPORT*  Clinical Data: Shortness of breath.  CHEST - 2 VIEW  Comparison: None.  Findings: Paraspinous pulmonary vascularity are normal and the lungs are clear.  No osseous abnormality.  IMPRESSION: Normal chest.   Original Report Authenticated By: Francene Boyers, M.D.     1. Heart palpitations       MDM  Suspicion for ACS is low given pt age and lack of medical hx. Will check H/H, electrolytes, get EKG and CXR. CBG taken on ambulance @ 109. Vitals WNL.   Lab work, EKG, and CXR all returned negative for concern for ACS, anemia, hypoglycemia, or electrolyte imbalance. Discussed results with pt who feels it may be anxiety driven and was meaning to see a doctor for a few months now regarding her anxiety. Also discussed the benefits of smoking cessation not just for overall health and well being, but specific to these sx. Pt does have insurance, so advised her to call them and find a primary care doctor she could work with. Discussed with pt the importance of establishing a relationship with a primary care doctor to follow up.  At this time there does not appear to be any evidence of an acute emergency medical condition and the patient appears stable for discharge with appropriate outpatient follow up.Diagnosis was discussed with patient who verbalizes understanding and is agreeable to discharge. Pt case discussed with Dr. Bernette Mayers who agrees with my plan.       Glade Nurse, PA-C 03/15/13 [phone]

## 2013-03-15 NOTE — ED Provider Notes (Signed)
Medical screening examination/treatment/procedure(s) were performed by non-physician practitioner and as supervising physician I was immediately available for consultation/collaboration.   Charles B. Bernette Mayers, MD 03/15/13 1108

## 2013-03-15 NOTE — ED Notes (Signed)
Per EMS report: Pt states that she woke up and was going to take her jewelry off.  Pt began to feel dizzy and having palpitations.  Pt reports symptoms have been occuring for several weeks. Cbg: 109, EMS VS: BP: 108/82, HR: 86, RR: 22, 98%RA, IV: 20g LAC

## 2013-03-16 ENCOUNTER — Encounter (HOSPITAL_COMMUNITY): Payer: Self-pay | Admitting: Emergency Medicine

## 2013-03-16 ENCOUNTER — Emergency Department (HOSPITAL_COMMUNITY)
Admission: EM | Admit: 2013-03-16 | Discharge: 2013-03-16 | Disposition: A | Discharge status: Home / Self Care | Payer: Self-pay | Attending: Emergency Medicine | Admitting: Emergency Medicine

## 2013-03-16 DIAGNOSIS — Z792 Long term (current) use of antibiotics: Secondary | ICD-10-CM | POA: Insufficient documentation

## 2013-03-16 DIAGNOSIS — R0602 Shortness of breath: Secondary | ICD-10-CM | POA: Insufficient documentation

## 2013-03-16 DIAGNOSIS — Z8619 Personal history of other infectious and parasitic diseases: Secondary | ICD-10-CM | POA: Insufficient documentation

## 2013-03-16 DIAGNOSIS — R51 Headache: Secondary | ICD-10-CM | POA: Insufficient documentation

## 2013-03-16 DIAGNOSIS — R079 Chest pain, unspecified: Secondary | ICD-10-CM | POA: Insufficient documentation

## 2013-03-16 DIAGNOSIS — R002 Palpitations: Secondary | ICD-10-CM | POA: Insufficient documentation

## 2013-03-16 DIAGNOSIS — R42 Dizziness and giddiness: Secondary | ICD-10-CM | POA: Insufficient documentation

## 2013-03-16 DIAGNOSIS — F410 Panic disorder [episodic paroxysmal anxiety] without agoraphobia: Secondary | ICD-10-CM | POA: Insufficient documentation

## 2013-03-16 DIAGNOSIS — R209 Unspecified disturbances of skin sensation: Secondary | ICD-10-CM | POA: Insufficient documentation

## 2013-03-16 DIAGNOSIS — R259 Unspecified abnormal involuntary movements: Secondary | ICD-10-CM | POA: Insufficient documentation

## 2013-03-16 DIAGNOSIS — Z87891 Personal history of nicotine dependence: Secondary | ICD-10-CM | POA: Insufficient documentation

## 2013-03-16 MED ORDER — LORAZEPAM 1 MG PO TABS
1.0000 mg | ORAL_TABLET | Freq: Once | ORAL | Status: AC
  Administered 2013-03-16: 1 mg via ORAL
  Filled 2013-03-16: qty 1

## 2013-03-16 MED ORDER — LORAZEPAM 1 MG PO TABS: 1.0000 mg | ORAL_TABLET | Freq: Three times a day (TID) | ORAL | Status: DC | PRN

## 2013-03-16 NOTE — ED Notes (Signed)
Patient brought in by EMS, called for generalized weakness and fatigue. Patient was seen at [facility] last night for same issue. Patient states she has been feeling fatigue for past two weeks. Patient states she feels like she is going to pass out, "feels like everything is closing in". Patient ambulated to bed, but grabs on for assistance.

## 2013-03-16 NOTE — ED Provider Notes (Signed)
Medical screening examination/treatment/procedure(s) were performed by non-physician practitioner and as supervising physician I was immediately available for consultation/collaboration.  Geoffery Lyons, MD 03/16/13 [phone]

## 2013-03-16 NOTE — ED Notes (Signed)
Patient taken off of bedside monitor due to anxiety. Patient is concerned about the numbers and if it beeps. Explained to patient each number on the monitor and that everything was within normal limits. Patient continues to stress and worry, states she would rather turn it off at this time. Informed patient to continue to breathe in and out of nose and that I would continue to monitor. Patient is in no acute distress at this time. Patient asking how much longer before doctor will see her, informed patient the doctor would be in shortly and apologized for delay. Call light at bedside. Will continue to monitor.

## 2013-03-16 NOTE — ED Notes (Signed)
Patient placed on cardiac monitor. Patient appears to have anxiety. Informed patient to slow breathing, and to breath in through nose and out through her mouth to try to calm down. Patient in no acute distress. Will continue to monitor.

## 2013-03-16 NOTE — ED Provider Notes (Signed)
History     CSN: [id number]  Arrival date & time 03/16/13  0417   First MD Initiated Contact with Patient 03/16/13 [phone]      Chief Complaint  Patient presents with  . Fatigue    (Consider location/radiation/quality/duration/timing/severity/associated sxs/prior treatment) HPI History provided by pt and prior chart.  Pt c/o episode of palpitations, CP, SOB, headache, lightheadedness, tremors, LLE numbness since getting out of bed to remove her umbilical piercing this morning.  She collapsed and everything went black for a brief second, but she does not believe she lost consciousness.  Same occurred yesterday.  Has had these sx intermittently for the past 2 weeks, but did not actually collapse until yesterday.  Pt has no PMH, including anxiety, but there is a FH of anxiety.  No recent illnesses.  No alcohol/drug abuse.   Past Medical History  Diagnosis Date  . No pertinent past medical history   . HPV in female   . Chlamydia   . Gonorrhea   . Abnormal Pap smear     Past Surgical History  Procedure Laterality Date  . No past surgeries      Family History  Problem Relation Age of Onset  . Alcohol abuse Mother   . Depression Mother   . Drug abuse Mother   . Heart disease Mother     mother had a closed valve and had heart surgery  . Alcohol abuse Father   . Drug abuse Father     History  Substance Use Topics  . Smoking status: Former Smoker -- 0.00 packs/day    Types: Cigarettes  . Smokeless tobacco: Never Used  . Alcohol Use: Yes     Comment: 1-2 glasses of wine per week    OB History   Grav Para Term Preterm Abortions TAB SAB Ect Mult Living   3 1 1  2  2   1       Review of Systems  All other systems reviewed and are negative.    Allergies  Review of patient's allergies indicates no known allergies.  Home Medications   Current Outpatient Rx  Name  Route  Sig  Dispense  Refill  . ibuprofen (ADVIL,MOTRIN) 800 MG tablet   Oral   Take 1 tablet (800 mg  total) by mouth every 8 (eight) hours as needed for pain.   30 tablet   0   . metroNIDAZOLE (FLAGYL) 500 MG tablet   Oral   Take 1 tablet (500 mg total) by mouth 2 (two) times daily.   14 tablet   0   . Multiple Vitamins-Minerals (MULTIVITAMIN WITH MINERALS) tablet   Oral   Take 1 tablet by mouth daily.           BP 111/76  Pulse 73  Temp(Src) 97.5 F (36.4 C) (Oral)  Resp 19  SpO2 100%  Physical Exam  Nursing note and vitals reviewed. Constitutional: She is oriented to person, place, and time. She appears well-developed and well-nourished. No distress.  HENT:  Head: Normocephalic and atraumatic.  Eyes:  Normal appearance  Neck: Normal range of motion.  Cardiovascular: Normal rate and regular rhythm.   Pulmonary/Chest: Effort normal and breath sounds normal. No respiratory distress.  Abdominal: Soft. She exhibits no distension. There is no tenderness.  Musculoskeletal: Normal range of motion.  Neurological: She is alert and oriented to person, place, and time.  Skin: Skin is warm and dry. No rash noted.  Psychiatric:  Pt appears very anxious.  ED Course  Procedures (including critical care time)  Labs Reviewed - No data to display No results found.   No diagnosis found.    MDM  21yo healthy F presents w/ symptoms and exam that is most consistent w/ anxiety attack.  Was evaluated for same sx in ED yesterday and EKG and labs unremarkable.  Will treat w/ po ativan and d/c home w/ short course of same + referral to behavior health clinics.          Otilio Miu, PA-C 03/16/13 [phone]

## 2013-03-16 NOTE — ED Notes (Signed)
Patient given discharge instructions for anxiety and panic attacks. rx for ativan given. Advised to follow up with primary care. Patient voiced understanding of all instructions and had no further questions. Patient ambulated to front lobby.

## 2013-03-19 ENCOUNTER — Emergency Department (HOSPITAL_COMMUNITY)
Admission: EM | Admit: 2013-03-19 | Discharge: 2013-03-19 | Disposition: A | Discharge status: Home / Self Care | Payer: Self-pay | Attending: Emergency Medicine | Admitting: Emergency Medicine

## 2013-03-19 ENCOUNTER — Encounter (HOSPITAL_COMMUNITY): Payer: Self-pay

## 2013-03-19 DIAGNOSIS — M549 Dorsalgia, unspecified: Secondary | ICD-10-CM | POA: Insufficient documentation

## 2013-03-19 DIAGNOSIS — R6883 Chills (without fever): Secondary | ICD-10-CM | POA: Insufficient documentation

## 2013-03-19 DIAGNOSIS — Z3202 Encounter for pregnancy test, result negative: Secondary | ICD-10-CM | POA: Insufficient documentation

## 2013-03-19 DIAGNOSIS — R110 Nausea: Secondary | ICD-10-CM | POA: Insufficient documentation

## 2013-03-19 DIAGNOSIS — Z2089 Contact with and (suspected) exposure to other communicable diseases: Secondary | ICD-10-CM | POA: Insufficient documentation

## 2013-03-19 DIAGNOSIS — N12 Tubulo-interstitial nephritis, not specified as acute or chronic: Secondary | ICD-10-CM | POA: Insufficient documentation

## 2013-03-19 DIAGNOSIS — F411 Generalized anxiety disorder: Secondary | ICD-10-CM | POA: Insufficient documentation

## 2013-03-19 DIAGNOSIS — Z8742 Personal history of other diseases of the female genital tract: Secondary | ICD-10-CM | POA: Insufficient documentation

## 2013-03-19 LAB — BASIC METABOLIC PANEL
BUN: 12 mg/dL (ref 6–23)
Calcium: 9.2 mg/dL (ref 8.4–10.5)
Creatinine, Ser: 0.87 mg/dL (ref 0.50–1.10)
GFR calc Af Amer: >90 mL/min (ref >90)
GFR calc non Af Amer: >90 mL/min (ref >90)
Potassium: 3.5 mEq/L (ref 3.5–5.1)

## 2013-03-19 LAB — URINALYSIS, ROUTINE W REFLEX MICROSCOPIC
Bilirubin Urine: NEGATIVE
Ketones, ur: NEGATIVE mg/dL
Nitrite: POSITIVE — AB
Urobilinogen, UA: 1 mg/dL (ref 0.0–1.0)

## 2013-03-19 LAB — CBC WITH DIFFERENTIAL/PLATELET
Basophils Relative: 0 % (ref 0–1)
Eosinophils Absolute: 0.1 10*3/uL (ref 0.0–0.7)
Hemoglobin: 13.7 g/dL (ref 12.0–15.0)
MCH: 31.4 pg (ref 26.0–34.0)
MCHC: 35 g/dL (ref 30.0–36.0)
Monocytes Relative: 7 % (ref 3–12)
Neutrophils Relative %: 73 % (ref 43–77)

## 2013-03-19 LAB — URINE MICROSCOPIC-ADD ON

## 2013-03-19 MED ORDER — MORPHINE SULFATE 4 MG/ML IJ SOLN
2.0000 mg | Freq: Once | INTRAMUSCULAR | Status: AC
  Administered 2013-03-19: 2 mg via INTRAVENOUS
  Filled 2013-03-19: qty 1

## 2013-03-19 MED ORDER — SODIUM CHLORIDE 0.9 % IV BOLUS (SEPSIS)
500.0000 mL | Freq: Once | INTRAVENOUS | Status: AC
  Administered 2013-03-19: 500 mL via INTRAVENOUS

## 2013-03-19 MED ORDER — LORAZEPAM 2 MG/ML IJ SOLN
1.0000 mg | Freq: Once | INTRAMUSCULAR | Status: AC
  Administered 2013-03-19: 1 mg via INTRAVENOUS
  Filled 2013-03-19: qty 1

## 2013-03-19 MED ORDER — DEXTROSE 5 % IV SOLN
1.0000 g | Freq: Once | INTRAVENOUS | Status: AC
  Administered 2013-03-19: 1 g via INTRAVENOUS
  Filled 2013-03-19: qty 10

## 2013-03-19 MED ORDER — TRAMADOL HCL 50 MG PO TABS
50.0000 mg | ORAL_TABLET | Freq: Once | ORAL | Status: AC
  Administered 2013-03-19: 50 mg via ORAL
  Filled 2013-03-19: qty 1

## 2013-03-19 MED ORDER — ONDANSETRON HCL 4 MG/2ML IJ SOLN
4.0000 mg | INTRAMUSCULAR | Status: AC
  Administered 2013-03-19: 4 mg via INTRAVENOUS
  Filled 2013-03-19: qty 2

## 2013-03-19 MED ORDER — SULFAMETHOXAZOLE-TRIMETHOPRIM 800-160 MG PO TABS: 1.0000 | ORAL_TABLET | Freq: Two times a day (BID) | ORAL | Status: DC

## 2013-03-19 MED ORDER — TRAMADOL HCL 50 MG PO TABS: 50.0000 mg | ORAL_TABLET | Freq: Four times a day (QID) | ORAL | Status: DC | PRN

## 2013-03-19 NOTE — ED Provider Notes (Signed)
History     CSN: [id number]  Arrival date & time 03/19/13  0709   First MD Initiated Contact with Patient 03/19/13 [phone]      Chief Complaint  Patient presents with  . Flank Pain    (Consider location/radiation/quality/duration/timing/severity/associated sxs/prior treatment) HPI  Pt is a 22 yo F PMHx significant for anxiety presenting for worsening sharp constant non-radiating right sided flank pain that began last night. Pt first noticed pain while she was walking. States she was able to relieve pain with two advil PM and sleep, but pain worsened and woke her up at 4AM this morning. States Advil is no longer alleviating pain. Laying down on her back worsens the pain. Pt has no history of abdominal or pelvic surgeries. Denies fevers, CP, SOB, abdominal pain, dysuria, urgency or frequency. Pt reports a normal BM this morning. LMP one month ago.   Past Medical History  Diagnosis Date  . No pertinent past medical history   . HPV in female   . Chlamydia   . Gonorrhea   . Abnormal Pap smear     Past Surgical History  Procedure Laterality Date  . No past surgeries      Family History  Problem Relation Age of Onset  . Alcohol abuse Mother   . Depression Mother   . Drug abuse Mother   . Heart disease Mother     mother had a closed valve and had heart surgery  . Alcohol abuse Father   . Drug abuse Father     History  Substance Use Topics  . Smoking status: Former Smoker -- 0.00 packs/day    Types: Cigarettes  . Smokeless tobacco: Never Used  . Alcohol Use: Yes     Comment: 1-2 glasses of wine per week    OB History   Grav Para Term Preterm Abortions TAB SAB Ect Mult Living   3 1 1  2  2   1       Review of Systems  Constitutional: Positive for chills.  HENT: Negative for neck pain.   Eyes: Negative for visual disturbance.  Respiratory: Negative for shortness of breath.   Cardiovascular: Negative for chest pain.  Gastrointestinal: Positive for nausea. Negative for  vomiting, abdominal pain, diarrhea, constipation and blood in stool.  Genitourinary: Positive for flank pain. Negative for frequency and vaginal pain.  Musculoskeletal: Positive for back pain.  Neurological: Negative for light-headedness and headaches.    Allergies  Review of patient's allergies indicates no known allergies.  Home Medications   Current Outpatient Rx  Name  Route  Sig  Dispense  Refill  . Ibuprofen-Diphenhydramine Cit (ADVIL PM PO)   Oral   Take 2 tablets by mouth at bedtime as needed (for pain/sleep).         Marland Kitchen sulfamethoxazole-trimethoprim (SEPTRA DS) 800-160 MG per tablet   Oral   Take 1 tablet by mouth every 12 (twelve) hours.   28 tablet   0   . traMADol (ULTRAM) 50 MG tablet   Oral   Take 1 tablet (50 mg total) by mouth every 6 (six) hours as needed for pain.   12 tablet   0     BP 124/64  Pulse 92  Temp(Src) 97.6 F (36.4 C) (Oral)  Resp 20  Ht 5\' 5"  (1.651 m)  Wt 120 lb (54.432 kg)  BMI 19.97 kg/m2  SpO2 99%  LMP 02/17/2013  Physical Exam  Constitutional: She is oriented to person, place, and time. She appears well-developed  and well-nourished. No distress.  HENT:  Head: Normocephalic and atraumatic.  Mouth/Throat: Oropharynx is clear and moist.  Eyes: Conjunctivae and EOM are normal. Pupils are equal, round, and reactive to light.  Neck: Neck supple.  Cardiovascular: Normal rate, regular rhythm and normal heart sounds.   Pulmonary/Chest: Effort normal and breath sounds normal. No respiratory distress.  Abdominal: Soft. Bowel sounds are normal. She exhibits no distension and no mass. There is no hepatosplenomegaly. There is no tenderness. There is no rebound and no guarding.  Mild tender to palpation right flank area, no CVA tenderness.   Neurological: She is alert and oriented to person, place, and time.  Skin: Skin is warm and dry. She is not diaphoretic.  Psychiatric: Her mood appears anxious.    ED Course  Procedures (including  critical care time)  Medications  sodium chloride 0.9 % bolus 500 mL (0 mLs Intravenous Stopped 03/19/13 1034)  morphine 4 MG/ML injection 2 mg (2 mg Intravenous Given 03/19/13 0806)  LORazepam (ATIVAN) injection 1 mg (1 mg Intravenous Given 03/19/13 0849)  ondansetron (ZOFRAN) injection 4 mg (4 mg Intravenous Given 03/19/13 0848)  cefTRIAXone (ROCEPHIN) 1 g in dextrose 5 % 50 mL IVPB (0 g Intravenous Stopped 03/19/13 1049)  morphine 4 MG/ML injection 2 mg (2 mg Intravenous Given 03/19/13 0937)  traMADol (ULTRAM) tablet 50 mg (50 mg Oral Given 03/19/13 1117)     Labs Reviewed  URINALYSIS, ROUTINE W REFLEX MICROSCOPIC - Abnormal; Notable for the following:    APPearance TURBID (*)    Hgb urine dipstick MODERATE (*)    Protein, ur 100 (*)    Nitrite POSITIVE (*)    Leukocytes, UA LARGE (*)    All other components within normal limits  CBC WITH DIFFERENTIAL - Abnormal; Notable for the following:    WBC 13.4 (*)    Neutro Abs 9.8 (*)    All other components within normal limits  BASIC METABOLIC PANEL - Abnormal; Notable for the following:    Glucose, Bld 102 (*)    All other components within normal limits  URINE MICROSCOPIC-ADD ON - Abnormal; Notable for the following:    Bacteria, UA MANY (*)    All other components within normal limits  URINE CULTURE  PREGNANCY, URINE   No results found.   1. Pyelonephritis       MDM  Pt diagnosed with pyelonephritis. Non surgical abdomen on initial evaluation and on re-evaluation. No other concerning PE findings. VSS. Labs reviewed. Pain managed in ED. Pt given one dose of IV Rocephin and fluid bolus while in ED. Sent home with PO Abx and pain management. Return precautions given. Patient agreeable to plan. Patient d/w with Dr. Ignacia Palma, agrees with plan. Patient is stable at time of discharge.           Jeannetta Ellis, PA-C 03/19/13 1651

## 2013-03-19 NOTE — ED Notes (Signed)
Right sided flank pain started yesterday evening. Woke up early this AM with increased pain. Pt denies urinary symptoms.

## 2013-03-19 NOTE — ED Notes (Signed)
Pt given a bag sandwich and Ginger Ale.

## 2013-03-20 ENCOUNTER — Inpatient Hospital Stay (HOSPITAL_COMMUNITY)
Admission: EM | Admit: 2013-03-20 | Discharge: 2013-03-24 | DRG: 690 | Disposition: A | Discharge status: Home / Self Care | Payer: MEDICAID | Attending: Internal Medicine | Admitting: Internal Medicine

## 2013-03-20 ENCOUNTER — Encounter (HOSPITAL_COMMUNITY): Payer: Self-pay | Admitting: Adult Health

## 2013-03-20 DIAGNOSIS — Z6379 Other stressful life events affecting family and household: Secondary | ICD-10-CM

## 2013-03-20 DIAGNOSIS — Z720 Tobacco use: Secondary | ICD-10-CM | POA: Diagnosis present

## 2013-03-20 DIAGNOSIS — L02219 Cutaneous abscess of trunk, unspecified: Secondary | ICD-10-CM | POA: Diagnosis present

## 2013-03-20 DIAGNOSIS — L03319 Cellulitis of trunk, unspecified: Secondary | ICD-10-CM | POA: Diagnosis present

## 2013-03-20 DIAGNOSIS — F1210 Cannabis abuse, uncomplicated: Secondary | ICD-10-CM | POA: Diagnosis present

## 2013-03-20 DIAGNOSIS — R079 Chest pain, unspecified: Secondary | ICD-10-CM | POA: Diagnosis present

## 2013-03-20 DIAGNOSIS — N12 Tubulo-interstitial nephritis, not specified as acute or chronic: Secondary | ICD-10-CM | POA: Diagnosis present

## 2013-03-20 DIAGNOSIS — F17200 Nicotine dependence, unspecified, uncomplicated: Secondary | ICD-10-CM | POA: Diagnosis present

## 2013-03-20 LAB — CBC WITH DIFFERENTIAL/PLATELET
Eosinophils Relative: 0 % (ref 0–5)
HCT: 41.3 % (ref 36.0–46.0)
Lymphocytes Relative: 7 % — ABNORMAL LOW (ref 12–46)
Lymphs Abs: 0.8 10*3/uL (ref 0.7–4.0)
MCV: 89.4 fL (ref 78.0–100.0)
Monocytes Absolute: 0.9 10*3/uL (ref 0.1–1.0)
Neutro Abs: 9.9 10*3/uL — ABNORMAL HIGH (ref 1.7–7.7)
RBC: 4.62 MIL/uL (ref 3.87–5.11)
WBC: 11.6 10*3/uL — ABNORMAL HIGH (ref 4.0–10.5)

## 2013-03-20 LAB — COMPREHENSIVE METABOLIC PANEL
Albumin: 4.1 g/dL (ref 3.5–5.2)
BUN: 7 mg/dL (ref 6–23)
Calcium: 9.6 mg/dL (ref 8.4–10.5)
Creatinine, Ser: 0.97 mg/dL (ref 0.50–1.10)
GFR calc Af Amer: >90 mL/min (ref >90)
Total Protein: 7.8 g/dL (ref 6.0–8.3)

## 2013-03-20 LAB — URINALYSIS, ROUTINE W REFLEX MICROSCOPIC
Bilirubin Urine: NEGATIVE
Glucose, UA: NEGATIVE mg/dL
Ketones, ur: NEGATIVE mg/dL
Nitrite: NEGATIVE
Specific Gravity, Urine: 1.009 (ref 1.005–1.030)
pH: 6 (ref 5.0–8.0)

## 2013-03-20 LAB — URINE MICROSCOPIC-ADD ON

## 2013-03-20 MED ORDER — ONDANSETRON 4 MG PO TBDP
8.0000 mg | ORAL_TABLET | Freq: Once | ORAL | Status: AC
  Administered 2013-03-20: 8 mg via ORAL
  Filled 2013-03-20: qty 2

## 2013-03-20 NOTE — ED Provider Notes (Signed)
Medical screening examination/treatment/procedure(s) were performed by non-physician practitioner and as supervising physician I was immediately available for consultation/collaboration.   Liliauna Santoni III, MD 03/20/13 1006 

## 2013-03-20 NOTE — ED Notes (Signed)
NURSE FIRST ROUNDS : PT. CURRENTLY RECEIVING NS IV AT TRIAGE WITH NO DISTRESS.  , RESPIRATIONS UNLABORED / IV SITE UNREMARKABLE , NO PAIN AT THIS TIME .

## 2013-03-20 NOTE — ED Notes (Signed)
Presents with nausea, vomiting and right flank pain. She was seen here 2 days ago and DX with a kidney infection, sent home with antibiotics, unable to fill the RX due to finances.

## 2013-03-20 NOTE — ED Notes (Signed)
Pt is refusing to speak to this RN or provider at this time. Pt states "this is f*ing ridiculous.  The staff here just treats me like I am not a human. They just stick a thing under my tongue, tie a thing around my arm and just take my blood. I only want to talk to the manager right now." AD Leretha Dykes made aware.

## 2013-03-21 ENCOUNTER — Observation Stay (HOSPITAL_COMMUNITY): Payer: Self-pay

## 2013-03-21 ENCOUNTER — Encounter (HOSPITAL_COMMUNITY): Payer: Self-pay | Admitting: Internal Medicine

## 2013-03-21 DIAGNOSIS — Z720 Tobacco use: Secondary | ICD-10-CM | POA: Diagnosis present

## 2013-03-21 DIAGNOSIS — N12 Tubulo-interstitial nephritis, not specified as acute or chronic: Secondary | ICD-10-CM | POA: Diagnosis present

## 2013-03-21 DIAGNOSIS — F17200 Nicotine dependence, unspecified, uncomplicated: Secondary | ICD-10-CM

## 2013-03-21 LAB — CBC WITH DIFFERENTIAL/PLATELET
HCT: 35 % — ABNORMAL LOW (ref 36.0–46.0)
Hemoglobin: 12.1 g/dL (ref 12.0–15.0)
Lymphocytes Relative: 20 % (ref 12–46)
MCHC: 34.6 g/dL (ref 30.0–36.0)
Monocytes Absolute: 0.5 10*3/uL (ref 0.1–1.0)
Monocytes Relative: 9 % (ref 3–12)
Neutro Abs: 4.1 10*3/uL (ref 1.7–7.7)

## 2013-03-21 LAB — COMPREHENSIVE METABOLIC PANEL
ALT: 8 U/L (ref 0–35)
Albumin: 3.1 g/dL — ABNORMAL LOW (ref 3.5–5.2)
Calcium: 8.4 mg/dL (ref 8.4–10.5)
GFR calc Af Amer: >90 mL/min (ref >90)
Glucose, Bld: 100 mg/dL — ABNORMAL HIGH (ref 70–99)
Sodium: 136 mEq/L (ref 135–145)
Total Protein: 6.4 g/dL (ref 6.0–8.3)

## 2013-03-21 LAB — URINE CULTURE: Colony Count: >=100000

## 2013-03-21 MED ORDER — MORPHINE SULFATE 2 MG/ML IJ SOLN
1.0000 mg | INTRAMUSCULAR | Status: DC | PRN
  Administered 2013-03-21 – 2013-03-22 (×9): 1 mg via INTRAVENOUS
  Filled 2013-03-21 (×10): qty 1

## 2013-03-21 MED ORDER — ACETAMINOPHEN 325 MG PO TABS
650.0000 mg | ORAL_TABLET | Freq: Four times a day (QID) | ORAL | Status: DC | PRN
  Administered 2013-03-22: 650 mg via ORAL
  Filled 2013-03-21: qty 2

## 2013-03-21 MED ORDER — ONDANSETRON HCL 4 MG/2ML IJ SOLN
4.0000 mg | Freq: Four times a day (QID) | INTRAMUSCULAR | Status: DC | PRN
  Administered 2013-03-21 – 2013-03-23 (×5): 4 mg via INTRAVENOUS
  Filled 2013-03-21 (×5): qty 2

## 2013-03-21 MED ORDER — LORAZEPAM 1 MG PO TABS
1.0000 mg | ORAL_TABLET | Freq: Once | ORAL | Status: AC
  Administered 2013-03-21: 1 mg via ORAL
  Filled 2013-03-21: qty 2

## 2013-03-21 MED ORDER — SODIUM CHLORIDE 0.9 % IV BOLUS (SEPSIS)
1000.0000 mL | Freq: Once | INTRAVENOUS | Status: AC
  Administered 2013-03-21: 1000 mL via INTRAVENOUS

## 2013-03-21 MED ORDER — OXYCODONE HCL 5 MG PO TABS
5.0000 mg | ORAL_TABLET | Freq: Four times a day (QID) | ORAL | Status: DC | PRN
  Administered 2013-03-21 – 2013-03-23 (×4): 5 mg via ORAL
  Filled 2013-03-21 (×4): qty 1

## 2013-03-21 MED ORDER — ONDANSETRON HCL 4 MG PO TABS
4.0000 mg | ORAL_TABLET | Freq: Four times a day (QID) | ORAL | Status: DC | PRN
  Filled 2013-03-21 (×2): qty 1

## 2013-03-21 MED ORDER — ONDANSETRON HCL 4 MG/2ML IJ SOLN
4.0000 mg | Freq: Once | INTRAMUSCULAR | Status: AC
  Administered 2013-03-21: 4 mg via INTRAVENOUS
  Filled 2013-03-21: qty 2

## 2013-03-21 MED ORDER — HYDROMORPHONE HCL PF 1 MG/ML IJ SOLN
0.5000 mg | Freq: Once | INTRAMUSCULAR | Status: AC
  Administered 2013-03-21: 0.5 mg via INTRAVENOUS

## 2013-03-21 MED ORDER — HYDROMORPHONE HCL PF 1 MG/ML IJ SOLN
1.0000 mg | Freq: Once | INTRAMUSCULAR | Status: AC
  Administered 2013-03-21: 1 mg via INTRAVENOUS
  Filled 2013-03-21: qty 1

## 2013-03-21 MED ORDER — DEXTROSE 5 % IV SOLN
1.0000 g | Freq: Once | INTRAVENOUS | Status: AC
  Administered 2013-03-21: 1 g via INTRAVENOUS
  Filled 2013-03-21: qty 10

## 2013-03-21 MED ORDER — KETOROLAC TROMETHAMINE 30 MG/ML IJ SOLN
INTRAMUSCULAR | Status: AC
  Filled 2013-03-21: qty 1

## 2013-03-21 MED ORDER — ENOXAPARIN SODIUM 40 MG/0.4ML ~~LOC~~ SOLN
40.0000 mg | SUBCUTANEOUS | Status: DC
  Administered 2013-03-23: 40 mg via SUBCUTANEOUS
  Filled 2013-03-21 (×4): qty 0.4

## 2013-03-21 MED ORDER — ACETAMINOPHEN 650 MG RE SUPP: 650.0000 mg | Freq: Four times a day (QID) | RECTAL | Status: DC | PRN

## 2013-03-21 MED ORDER — KETOROLAC TROMETHAMINE 30 MG/ML IJ SOLN
30.0000 mg | Freq: Once | INTRAMUSCULAR | Status: AC
  Administered 2013-03-21: 30 mg via INTRAVENOUS
  Filled 2013-03-21: qty 1

## 2013-03-21 MED ORDER — CEFTRIAXONE SODIUM 1 G IJ SOLR
1.0000 g | INTRAMUSCULAR | Status: DC
  Administered 2013-03-21 – 2013-03-22 (×2): 1 g via INTRAVENOUS
  Filled 2013-03-21 (×3): qty 10

## 2013-03-21 MED ORDER — LORAZEPAM 1 MG PO TABS
1.0000 mg | ORAL_TABLET | Freq: Once | ORAL | Status: AC
  Administered 2013-03-21: 1 mg via ORAL
  Filled 2013-03-21: qty 1

## 2013-03-21 MED ORDER — KETOROLAC TROMETHAMINE 30 MG/ML IJ SOLN
30.0000 mg | Freq: Once | INTRAMUSCULAR | Status: AC
  Administered 2013-03-21: 30 mg via INTRAVENOUS

## 2013-03-21 MED ORDER — HYDROMORPHONE HCL PF 1 MG/ML IJ SOLN
0.5000 mg | Freq: Once | INTRAMUSCULAR | Status: DC
  Filled 2013-03-21: qty 1

## 2013-03-21 MED ORDER — SODIUM CHLORIDE 0.9 % IV SOLN
INTRAVENOUS | Status: AC
  Administered 2013-03-21 (×2): via INTRAVENOUS

## 2013-03-21 NOTE — ED Provider Notes (Signed)
Medical screening examination/treatment/procedure(s) were performed by non-physician practitioner and as supervising physician I was immediately available for consultation/collaboration. Devoria Albe, MD, FACEP   Ward Givens, MD 03/21/13 [phone]

## 2013-03-21 NOTE — ED Notes (Signed)
Pt continues to request pain rx. NP made aware. Explained that NP will be in as soon as possible to pt. NAD noted. Pt resting comfortably in bed.

## 2013-03-21 NOTE — H&P (Signed)
[facility] History and Physical  Brandy Kidd ZOX:[id number] DOB: [birth year] DOA: 03/20/2013  Referring physician: ER physician. PCP: No PCP Per Patient  Chief Complaint: Right flank pain and nausea vomiting.  HPI: Brandy Kidd is a 22 y.o. female who had come to the ER 2 days ago with right flank pain and was diagnosed with pyelonephritis and was discharged on Bactrim and tramadol presents back with right flank pain and at this time has persistent nausea vomiting unable to keep anything. Patient states that she had not filled her medications because she was unable to afford it. At this time patient has been admitted for IV antibiotics. Renal sonogram does not show any hydronephrosis. On exam patient does have right flank tenderness. Patient also has a mild cellulitis around the umbilicus when she had a ring.  Review of Systems: As presented in the history of presenting illness, rest negative.  Past Medical History  Diagnosis Date  . No pertinent past medical history   . HPV in female   . Chlamydia   . Gonorrhea   . Abnormal Pap smear    Past Surgical History  Procedure Laterality Date  . No past surgeries     Social History:  reports that she has quit smoking. Her smoking use included Cigarettes. She smoked 0.00 packs per day. She has never used smokeless tobacco. She reports that  drinks alcohol. She reports that she does not use illicit drugs. Lives at home. where does patient live-- Can do ADLs. Can patient participate in ADLs?  No Known Allergies  Family History  Problem Relation Age of Onset  . Alcohol abuse Mother   . Depression Mother   . Drug abuse Mother   . Heart disease Mother     mother had a closed valve and had heart surgery  . Alcohol abuse Father   . Drug abuse Father       Prior to Admission medications   Medication Sig Start Date End Date Taking? Authorizing Provider  Ibuprofen-Diphenhydramine Cit (ADVIL PM PO) Take 2 tablets by mouth at  bedtime as needed (for pain/sleep).   Yes Historical Provider, MD  sulfamethoxazole-trimethoprim (SEPTRA DS) 800-160 MG per tablet Take 1 tablet by mouth every 12 (twelve) hours. 03/19/13  Yes Brandy L Piepenbrink, PA-C  traMADol (ULTRAM) 50 MG tablet Take 1 tablet (50 mg total) by mouth every 6 (six) hours as needed for pain. 03/19/13  Yes Brandy Ellis, PA-C   Physical Exam: Filed Vitals:   03/20/13 1952 03/20/13 2149 03/21/13 0300 03/21/13 0417  BP: 110/79 115/67 114/67 110/74  Pulse: 125 109 99 84  Temp: 98.8 F (37.1 C) 99.4 F (37.4 C)  98.2 F (36.8 C)  TempSrc: Oral Oral  Oral  Resp: 18 14  16   SpO2: 98%  97% 100%     General:  Well-developed well-nourished.  Eyes: Anicteric no pallor.  ENT: No discharge from ears eyes nose mouth.  Neck: No mass felt.  Cardiovascular: S1-S2 heard.  Respiratory: No rhonchi or crepitations.  Abdomen: Soft mild tenderness in the right flank area. No guarding no rigidity. Bowel sounds present. There is a small discolored area around the umbilicus suggestive of cellulitis.  Skin: Mild cellulitis around the umbilicus.  Musculoskeletal: No edema.  Psychiatric: Appears normal.  Neurologic: Alert awake oriented to time place and person. Moves all extremities.  Labs on Admission:  Basic Metabolic Panel:  Recent Labs Lab 03/15/13 0701 03/19/13 0755 03/20/13 1958  NA 139 135 132*  K 3.6  3.5 4.0  CL 104 99 94*  CO2  --  23 25  GLUCOSE 100* 102* 125*  BUN 10 12 7   CREATININE 0.80 0.87 0.97  CALCIUM  --  9.2 9.6   Liver Function Tests:  Recent Labs Lab 03/20/13 1958  AST 12  ALT 8  ALKPHOS 51  BILITOT 0.6  PROT 7.8  ALBUMIN 4.1   No results found for this basename: LIPASE, AMYLASE,  in the last 168 hours No results found for this basename: AMMONIA,  in the last 168 hours CBC:  Recent Labs Lab 03/15/13 0652 03/15/13 0701 03/19/13 0755 03/20/13 1958  WBC 10.2  --  13.4* 11.6*  NEUTROABS 6.1  --  9.8*  9.9*  HGB 13.5 13.3 13.7 14.4  HCT 39.1 39.0 39.1 41.3  MCV 89.5  --  89.5 89.4  PLT 291  --  255 244   Cardiac Enzymes: No results found for this basename: CKTOTAL, CKMB, CKMBINDEX, TROPONINI,  in the last 168 hours  BNP (last 3 results) No results found for this basename: PROBNP,  in the last 8760 hours CBG: No results found for this basename: GLUCAP,  in the last 168 hours  Radiological Exams on Admission: US Renal  03/21/2013   *RADIOLOGY REPORT*  Clinical Data:  Flank pain.  RENAL/URINARY TRACT ULTRASOUND COMPLETE  Comparison:  None  Findings:  Right Kidney:  The right kidney measures 12.2 cm in length.  The kidney demonstrates normal size and configuration.  Unusually increased echogenicity at the medullary pyramids may reflect medullary sponge kidney, though a variety of other conditions could have a similar appearance.  No significant cortical thinning is seen.  No hydronephrosis or calcification is identified.  No masses are seen.  Left Kidney:  The left kidney measures 11.0 cm in length.  The kidney demonstrates normal size and configuration.  Unusually increased echogenicity at the medullary pyramids may reflect medullary sponge kidney, though a variety of other conditions could have a similar appearance.  No significant cortical thinning is seen.  No hydronephrosis or calcification is identified.  No masses are seen.  Bladder:  The bladder is mildly distended and is unremarkable in appearance.  Bilateral ureteral jets are visualized.  IMPRESSION:  1.  No evidence of hydronephrosis; bilateral ureteral jets noted at the bladder. 2.  Unusually increased echogenicity at the medullary pyramids is nonspecific; it could reflect medullary sponge kidney, though a variety of other conditions could have a similar appearance.   Original Report Authenticated By: Tonia Ghent, M.D.     Assessment/Plan Principal Problem:   Pyelonephritis Active Problems:   Tobacco abuse   1. Pyelonephritis  with mild periumbilical cellulitis - follow urine cultures. Continue ceftriaxone. Advance diet as tolerated. 2. Tobacco and marijuana abuse - cessation counseling requested.    Code Status: Full code.  Family Communication: None.  Disposition Plan: Admit to inpatient.    Brandy Kidd N. [facility] Pager [phone].  If 7PM-7AM, please contact night-coverage [email or web address] Password [facility] 03/21/2013, 4:49 AM

## 2013-03-21 NOTE — ED Notes (Signed)
Per friend at bedside, every time nurses leave room pt starts laughing and saying "I will for sure start getting pain medicine." Friends states "I am just concerned because I feel like she is using you guys to get medicine, and I just feel like I should tell someone." NP Dondra Spry made aware.

## 2013-03-21 NOTE — ED Notes (Signed)
Pt states that she feels like she has a fever. Made aware that Toradol will help lower fever.

## 2013-03-21 NOTE — ED Notes (Addendum)
This RN went to administer medications. Pt is upset that she is not getting Dilaudid IV, requesting to speak with provider. Made pt aware that NP is busy at this time and that I will let her know as soon as possible. Pt refusing all medications at this time.

## 2013-03-21 NOTE — Progress Notes (Signed)
UR COMPLETED  

## 2013-03-21 NOTE — ED Notes (Signed)
Pt remains very anxious. Requesting ativan. NP made aware.

## 2013-03-21 NOTE — ED Provider Notes (Signed)
History     CSN: [id number]  Arrival date & time 03/20/13  7829   First MD Initiated Contact with Patient 03/20/13 2336      Chief Complaint  Patient presents with  . Flank Pain    (Consider location/radiation/quality/duration/timing/severity/associated sxs/prior treatment) HPI Comments: Is a 22 year old female, who was diagnosed 2 days ago, with pyelonephritis, she was given prescriptions for Bactrim, and Ultram, which she did not fill, citing costs, returns tonight with fever, nausea, or vomiting, increased flank pain.  Patient is a 22 y.o. female presenting with flank pain. The history is provided by the patient.  Flank Pain This is a recurrent problem. The current episode started in the past 7 days. The problem occurs constantly. The problem has been gradually worsening. Associated symptoms include a fever, nausea, urinary symptoms and vomiting. Pertinent negatives include no chills or coughing. Nothing aggravates the symptoms. She has tried nothing for the symptoms. The treatment provided no relief.    Past Medical History  Diagnosis Date  . No pertinent past medical history   . HPV in female   . Chlamydia   . Gonorrhea   . Abnormal Pap smear     Past Surgical History  Procedure Laterality Date  . No past surgeries      Family History  Problem Relation Age of Onset  . Alcohol abuse Mother   . Depression Mother   . Drug abuse Mother   . Heart disease Mother     mother had a closed valve and had heart surgery  . Alcohol abuse Father   . Drug abuse Father     History  Substance Use Topics  . Smoking status: Former Smoker -- 0.00 packs/day    Types: Cigarettes  . Smokeless tobacco: Never Used  . Alcohol Use: Yes     Comment: 1-2 glasses of wine per week    OB History   Grav Para Term Preterm Abortions TAB SAB Ect Mult Living   3 1 1  2  2   1       Review of Systems  Constitutional: Positive for fever. Negative for chills.  Respiratory: Negative for  cough and shortness of breath.   Gastrointestinal: Positive for nausea and vomiting.  Genitourinary: Positive for dysuria, frequency and flank pain.  Skin: Positive for pallor.  All other systems reviewed and are negative.    Allergies  Review of patient's allergies indicates no known allergies.  Home Medications   Current Outpatient Rx  Name  Route  Sig  Dispense  Refill  . Ibuprofen-Diphenhydramine Cit (ADVIL PM PO)   Oral   Take 2 tablets by mouth at bedtime as needed (for pain/sleep).         Marland Kitchen sulfamethoxazole-trimethoprim (SEPTRA DS) 800-160 MG per tablet   Oral   Take 1 tablet by mouth every 12 (twelve) hours.   28 tablet   0   . traMADol (ULTRAM) 50 MG tablet   Oral   Take 1 tablet (50 mg total) by mouth every 6 (six) hours as needed for pain.   12 tablet   0     BP 115/67  Pulse 109  Temp(Src) 99.4 F (37.4 C) (Oral)  Resp 14  SpO2 98%  LMP 02/17/2013  Physical Exam  Nursing note and vitals reviewed. Constitutional: She appears well-developed and well-nourished. She appears distressed.  HENT:  Head: Normocephalic.  Eyes: Pupils are equal, round, and reactive to light.  Neck: Normal range of motion.  Cardiovascular: Regular rhythm.  Tachycardia present.   Pulmonary/Chest: Effort normal and breath sounds normal.  Abdominal: Soft. Bowel sounds are normal.  Musculoskeletal: Normal range of motion.  Neurological: She is alert.  Skin: Skin is warm and dry. No rash noted. There is pallor.    ED Course  Procedures (including critical care time)  Labs Reviewed  COMPREHENSIVE METABOLIC PANEL - Abnormal; Notable for the following:    Sodium 132 (*)    Chloride 94 (*)    Glucose, Bld 125 (*)    GFR calc non Af Amer 84 (*)    All other components within normal limits  CBC WITH DIFFERENTIAL - Abnormal; Notable for the following:    WBC 11.6 (*)    Neutrophils Relative % 86 (*)    Neutro Abs 9.9 (*)    Lymphocytes Relative 7 (*)    All other  components within normal limits  URINALYSIS, ROUTINE W REFLEX MICROSCOPIC - Abnormal; Notable for the following:    Hgb urine dipstick LARGE (*)    Leukocytes, UA SMALL (*)    All other components within normal limits  URINE MICROSCOPIC-ADD ON - Abnormal; Notable for the following:    Squamous Epithelial / LPF MANY (*)    All other components within normal limits  POCT PREGNANCY, URINE   No results found.   1. Pyelonephritis       MDM  Patient has required multiple doses of IV pain medication.  She does receive 1 g of Rocephin to treat her UTI and IV fluids.  Due to inability to get this patient, comfortable, we'll admit for IV antibiotics, and pain control.  Admitting hospitalist has requested a renal ultrasound to rule out hydronephrosis         Arman Filter, NP 03/21/13 0252  Arman Filter, NP 03/21/13 [phone]

## 2013-03-21 NOTE — Progress Notes (Signed)
PATIENT DETAILS Name: Brandy Kidd Age: 22 y.o. Sex: female Date of Birth: Feb 20, 1991 Admit Date: 03/20/2013 Admitting Physician Eduard Clos, MD PCP:No PCP Per Patient  Subjective: Still nauseous-but slightly better  Assessment/Plan: Principal Problem:   Right sided Pyelonephritis -clinically better-although not much oral intake as still feels nauseous -c/w Rocephin till oral intake stable-then likely can transition to oral antibiotics and discharge -Urine cx positive for pansensitive E Coli, Ultrasound abdomen-shows no hydronephrosis  Active Problems: Tobacco and marijuana abuse  - cessation counseling requested.  Disposition: Remain inpatient  DVT Prophylaxis: Prophylactic Lovenox  Code Status: Full code  Family Communication None  Procedures:  None  CONSULTS:  None   MEDICATIONS: Scheduled Meds: . cefTRIAXone (ROCEPHIN)  IV  1 g Intravenous Q24H  . enoxaparin (LOVENOX) injection  40 mg Subcutaneous Q24H   Continuous Infusions: . sodium chloride 125 mL/hr at 03/21/13 0609   PRN Meds:.acetaminophen, acetaminophen, morphine injection, ondansetron (ZOFRAN) IV, ondansetron  Antibiotics: Anti-infectives   Start     Dose/Rate Route Frequency Ordered Stop   03/21/13 2200  cefTRIAXone (ROCEPHIN) 1 g in dextrose 5 % 50 mL IVPB     1 g 100 mL/hr over 30 Minutes Intravenous Every 24 hours 03/21/13 0535     03/21/13 0230  cefTRIAXone (ROCEPHIN) 1 g in dextrose 5 % 50 mL IVPB     1 g 100 mL/hr over 30 Minutes Intravenous  Once 03/21/13 0225 03/21/13 0344       PHYSICAL EXAM: Vital signs in last 24 hours: Filed Vitals:   03/20/13 2149 03/21/13 0300 03/21/13 0417 03/21/13 0500  BP: 115/67 114/67 110/74 124/64  Pulse: 109 99 84 99  Temp: 99.4 F (37.4 C)  98.2 F (36.8 C) 98.2 F (36.8 C)  TempSrc: Oral  Oral   Resp: 14  16 16   SpO2:  97% 100% 97%    Weight change:  There were no vitals filed for this visit. There is no weight on file to  calculate BMI.   Gen Exam: Awake and alert with clear speech.   Neck: Supple, No JVD.   Chest: B/L Clear.   CVS: S1 S2 Regular, no murmurs.  Abdomen: soft, BS +, non tender, non distended. Right CVA tender still Extremities: no edema, lower extremities warm to touch. Neurologic: Non Focal.   Skin: No Rash.   Wounds: N/A.    Intake/Output from previous day: No intake or output data in the 24 hours ending 03/21/13 1005   LAB RESULTS: CBC  Recent Labs Lab 03/15/13 0652 03/15/13 0701 03/19/13 0755 03/20/13 1958 03/21/13 0705  WBC 10.2  --  13.4* 11.6* 5.8  HGB 13.5 13.3 13.7 14.4 12.1  HCT 39.1 39.0 39.1 41.3 35.0*  PLT 291  --  255 244 196  MCV 89.5  --  89.5 89.4 88.8  MCH 30.9  --  31.4 31.2 30.7  MCHC 34.5  --  35.0 34.9 34.6  RDW 12.0  --  12.1 12.1 12.0  LYMPHSABS 3.4  --  2.5 0.8 1.2  MONOABS 0.6  --  1.0 0.9 0.5  EOSABS 0.1  --  0.1 0.0 0.0  BASOSABS 0.0  --  0.0 0.0 0.0    Chemistries   Recent Labs Lab 03/15/13 0701 03/19/13 0755 03/20/13 1958 03/21/13 0705  NA 139 135 132* 136  K 3.6 3.5 4.0 3.6  CL 104 99 94* 103  CO2  --  23 25 22   GLUCOSE 100* 102* 125* 100*  BUN 10 12 7  7  CREATININE 0.80 0.87 0.97 0.81  CALCIUM  --  9.2 9.6 8.4    CBG: No results found for this basename: GLUCAP,  in the last 168 hours  GFR The CrCl is unknown because both a height and weight (above a minimum accepted value) are required for this calculation.  Coagulation profile No results found for this basename: INR, PROTIME,  in the last 168 hours  Cardiac Enzymes No results found for this basename: CK, CKMB, TROPONINI, MYOGLOBIN,  in the last 168 hours  No components found with this basename: POCBNP,  No results found for this basename: DDIMER,  in the last 72 hours No results found for this basename: HGBA1C,  in the last 72 hours No results found for this basename: CHOL, HDL, LDLCALC, TRIG, CHOLHDL, LDLDIRECT,  in the last 72 hours No results found for this  basename: TSH, T4TOTAL, FREET3, T3FREE, THYROIDAB,  in the last 72 hours No results found for this basename: VITAMINB12, FOLATE, FERRITIN, TIBC, IRON, RETICCTPCT,  in the last 72 hours No results found for this basename: LIPASE, AMYLASE,  in the last 72 hours  Urine Studies No results found for this basename: UACOL, UAPR, USPG, UPH, UTP, UGL, UKET, UBIL, UHGB, UNIT, UROB, ULEU, UEPI, UWBC, URBC, UBAC, CAST, CRYS, UCOM, BILUA,  in the last 72 hours  MICROBIOLOGY: Recent Results (from the past 240 hour(s))  URINE CULTURE     Status: None   Collection Time    03/19/13  8:10 AM      Result Value Range Status   Specimen Description URINE, RANDOM   Final   Special Requests NONE   Final   Culture  Setup Time 03/19/2013 19:29   Final   Colony Count >=100,000 COLONIES/ML   Final   Culture ESCHERICHIA COLI   Final   Report Status 03/21/2013 FINAL   Final   Organism ID, Bacteria ESCHERICHIA COLI   Final    RADIOLOGY STUDIES/RESULTS: Dg Chest 2 View  03/15/2013   *RADIOLOGY REPORT*  Clinical Data: Shortness of breath.  CHEST - 2 VIEW  Comparison: None.  Findings: Paraspinous pulmonary vascularity are normal and the lungs are clear.  No osseous abnormality.  IMPRESSION: Normal chest.   Original Report Authenticated By: Francene Boyers, M.D.   US Renal  03/21/2013   *RADIOLOGY REPORT*  Clinical Data:  Flank pain.  RENAL/URINARY TRACT ULTRASOUND COMPLETE  Comparison:  None  Findings:  Right Kidney:  The right kidney measures 12.2 cm in length.  The kidney demonstrates normal size and configuration.  Unusually increased echogenicity at the medullary pyramids may reflect medullary sponge kidney, though a variety of other conditions could have a similar appearance.  No significant cortical thinning is seen.  No hydronephrosis or calcification is identified.  No masses are seen.  Left Kidney:  The left kidney measures 11.0 cm in length.  The kidney demonstrates normal size and configuration.  Unusually  increased echogenicity at the medullary pyramids may reflect medullary sponge kidney, though a variety of other conditions could have a similar appearance.  No significant cortical thinning is seen.  No hydronephrosis or calcification is identified.  No masses are seen.  Bladder:  The bladder is mildly distended and is unremarkable in appearance.  Bilateral ureteral jets are visualized.  IMPRESSION:  1.  No evidence of hydronephrosis; bilateral ureteral jets noted at the bladder. 2.  Unusually increased echogenicity at the medullary pyramids is nonspecific; it could reflect medullary sponge kidney, though a variety of other conditions  could have a similar appearance.   Original Report Authenticated By: Tonia Ghent, M.D.    Jeoffrey Massed, MD  [facility] Pager:[phone]  If 7PM-7AM, please contact night-coverage [email or web address] Password [email or web address] 03/21/2013, 10:05 AM   LOS: 1 day

## 2013-03-22 MED ORDER — ONDANSETRON 8 MG PO TBDP
8.0000 mg | ORAL_TABLET | Freq: Three times a day (TID) | ORAL | Status: DC | PRN
  Administered 2013-03-22 – 2013-03-23 (×3): 8 mg via ORAL
  Filled 2013-03-22 (×4): qty 1

## 2013-03-22 MED ORDER — SODIUM CHLORIDE 0.9 % IV SOLN
INTRAVENOUS | Status: DC
  Administered 2013-03-22 – 2013-03-24 (×3): via INTRAVENOUS

## 2013-03-22 MED ORDER — POTASSIUM CHLORIDE CRYS ER 20 MEQ PO TBCR
40.0000 meq | EXTENDED_RELEASE_TABLET | Freq: Once | ORAL | Status: AC
  Administered 2013-03-23: 40 meq via ORAL
  Filled 2013-03-22 (×4): qty 2

## 2013-03-22 MED ORDER — LORAZEPAM 1 MG PO TABS
1.0000 mg | ORAL_TABLET | Freq: Once | ORAL | Status: AC
  Administered 2013-03-22: 1 mg via ORAL
  Filled 2013-03-22: qty 1

## 2013-03-22 NOTE — Progress Notes (Signed)
PATIENT DETAILS Name: Brandy Kidd Age: 22 y.o. Sex: female Date of Birth: [birth year] Admit Date: 03/20/2013 Admitting Physician Eduard Clos, MD PCP:No PCP Per Patient  Subjective: Has nausea and vomiting. Reported sublingual Zofran worked better than the IV.  Assessment/Plan: Principal Problem:   Right sided Pyelonephritis -clinically better-although not much oral intake as still feels nauseous -c/w Rocephin till oral intake stable-then likely can transition to oral antibiotics and discharge -Urine cx positive for pansensitive E Coli, Ultrasound abdomen-shows no hydronephrosis  Active Problems: Tobacco and marijuana abuse  - cessation counseling requested.  Disposition: Remain inpatient  DVT Prophylaxis: Prophylactic Lovenox  Code Status: Full code  Family Communication None  Procedures:  None  CONSULTS:  None   MEDICATIONS: Scheduled Meds: . cefTRIAXone (ROCEPHIN)  IV  1 g Intravenous Q24H  . enoxaparin (LOVENOX) injection  40 mg Subcutaneous Q24H   Continuous Infusions:   PRN Meds:.acetaminophen, acetaminophen, morphine injection, ondansetron (ZOFRAN) IV, ondansetron, oxyCODONE  Antibiotics: Anti-infectives   Start     Dose/Rate Route Frequency Ordered Stop   03/21/13 2200  cefTRIAXone (ROCEPHIN) 1 g in dextrose 5 % 50 mL IVPB     1 g 100 mL/hr over 30 Minutes Intravenous Every 24 hours 03/21/13 0535     03/21/13 0230  cefTRIAXone (ROCEPHIN) 1 g in dextrose 5 % 50 mL IVPB     1 g 100 mL/hr over 30 Minutes Intravenous  Once 03/21/13 0225 03/21/13 0344       PHYSICAL EXAM: Vital signs in last 24 hours: Filed Vitals:   03/21/13 1415 03/21/13 2155 03/22/13 0611 03/22/13 1400  BP: 100/62 104/60 110/67 99/61  Pulse: 74 69 75 56  Temp: 97.7 F (36.5 C) 98.4 F (36.9 C) 98.8 F (37.1 C) 97.7 F (36.5 C)  TempSrc:  Oral Oral   Resp: 16 16 16 14   SpO2:  99% 98% 100%    Weight change:  There were no vitals filed for this visit. There  is no weight on file to calculate BMI.   Gen Exam: Awake and alert with clear speech.   Neck: Supple, No JVD.   Chest: B/L Clear.   CVS: S1 S2 Regular, no murmurs.  Abdomen: soft, BS +, non tender, non distended. Right CVA tender still Extremities: no edema, lower extremities warm to touch. Neurologic: Non Focal.   Skin: No Rash.   Wounds: N/A.    Intake/Output from previous day:  Intake/Output Summary (Last 24 hours) at 03/22/13 1418 Last data filed at 03/21/13 2155  Gross per 24 hour  Intake    120 ml  Output      0 ml  Net    120 ml     LAB RESULTS: CBC  Recent Labs Lab 03/19/13 0755 03/20/13 1958 03/21/13 0705  WBC 13.4* 11.6* 5.8  HGB 13.7 14.4 12.1  HCT 39.1 41.3 35.0*  PLT 255 244 196  MCV 89.5 89.4 88.8  MCH 31.4 31.2 30.7  MCHC 35.0 34.9 34.6  RDW 12.1 12.1 12.0  LYMPHSABS 2.5 0.8 1.2  MONOABS 1.0 0.9 0.5  EOSABS 0.1 0.0 0.0  BASOSABS 0.0 0.0 0.0    Chemistries   Recent Labs Lab 03/19/13 0755 03/20/13 1958 03/21/13 0705  NA 135 132* 136  K 3.5 4.0 3.6  CL 99 94* 103  CO2 23 25 22   GLUCOSE 102* 125* 100*  BUN 12 7 7   CREATININE 0.87 0.97 0.81  CALCIUM 9.2 9.6 8.4    CBG: No results found for this basename:  GLUCAP,  in the last 168 hours  GFR The CrCl is unknown because both a height and weight (above a minimum accepted value) are required for this calculation.  Coagulation profile No results found for this basename: INR, PROTIME,  in the last 168 hours  Cardiac Enzymes No results found for this basename: CK, CKMB, TROPONINI, MYOGLOBIN,  in the last 168 hours  No components found with this basename: POCBNP,  No results found for this basename: DDIMER,  in the last 72 hours No results found for this basename: HGBA1C,  in the last 72 hours No results found for this basename: CHOL, HDL, LDLCALC, TRIG, CHOLHDL, LDLDIRECT,  in the last 72 hours No results found for this basename: TSH, T4TOTAL, FREET3, T3FREE, THYROIDAB,  in the last 72  hours No results found for this basename: VITAMINB12, FOLATE, FERRITIN, TIBC, IRON, RETICCTPCT,  in the last 72 hours No results found for this basename: LIPASE, AMYLASE,  in the last 72 hours  Urine Studies No results found for this basename: UACOL, UAPR, USPG, UPH, UTP, UGL, UKET, UBIL, UHGB, UNIT, UROB, ULEU, UEPI, UWBC, URBC, UBAC, CAST, CRYS, UCOM, BILUA,  in the last 72 hours  MICROBIOLOGY: Recent Results (from the past 240 hour(s))  URINE CULTURE     Status: None   Collection Time    03/19/13  8:10 AM      Result Value Range Status   Specimen Description URINE, RANDOM   Final   Special Requests NONE   Final   Culture  Setup Time 03/19/2013 19:29   Final   Colony Count >=100,000 COLONIES/ML   Final   Culture ESCHERICHIA COLI   Final   Report Status 03/21/2013 FINAL   Final   Organism ID, Bacteria ESCHERICHIA COLI   Final    RADIOLOGY STUDIES/RESULTS: Dg Chest 2 View  03/15/2013   *RADIOLOGY REPORT*  Clinical Data: Shortness of breath.  CHEST - 2 VIEW  Comparison: None.  Findings: Paraspinous pulmonary vascularity are normal and the lungs are clear.  No osseous abnormality.  IMPRESSION: Normal chest.   Original Report Authenticated By: Francene Boyers, M.D.   US Renal  03/21/2013   *RADIOLOGY REPORT*  Clinical Data:  Flank pain.  RENAL/URINARY TRACT ULTRASOUND COMPLETE  Comparison:  None  Findings:  Right Kidney:  The right kidney measures 12.2 cm in length.  The kidney demonstrates normal size and configuration.  Unusually increased echogenicity at the medullary pyramids may reflect medullary sponge kidney, though a variety of other conditions could have a similar appearance.  No significant cortical thinning is seen.  No hydronephrosis or calcification is identified.  No masses are seen.  Left Kidney:  The left kidney measures 11.0 cm in length.  The kidney demonstrates normal size and configuration.  Unusually increased echogenicity at the medullary pyramids may reflect medullary  sponge kidney, though a variety of other conditions could have a similar appearance.  No significant cortical thinning is seen.  No hydronephrosis or calcification is identified.  No masses are seen.  Bladder:  The bladder is mildly distended and is unremarkable in appearance.  Bilateral ureteral jets are visualized.  IMPRESSION:  1.  No evidence of hydronephrosis; bilateral ureteral jets noted at the bladder. 2.  Unusually increased echogenicity at the medullary pyramids is nonspecific; it could reflect medullary sponge kidney, though a variety of other conditions could have a similar appearance.   Original Report Authenticated By: Tonia Ghent, M.D.    Clint Lipps, MD  [facility] Pager:[phone]  If 7PM-7AM, please contact night-coverage [email or web address] Password [email or web address] 03/22/2013, 2:18 PM   LOS: 2 days

## 2013-03-23 LAB — BASIC METABOLIC PANEL
BUN: 5 mg/dL — ABNORMAL LOW (ref 6–23)
CO2: 27 mEq/L (ref 19–32)
Chloride: 104 mEq/L (ref 96–112)
Creatinine, Ser: 0.82 mg/dL (ref 0.50–1.10)

## 2013-03-23 LAB — CBC
Hemoglobin: 12.4 g/dL (ref 12.0–15.0)
MCH: 31.3 pg (ref 26.0–34.0)
Platelets: 241 10*3/uL (ref 150–400)
RBC: 3.96 MIL/uL (ref 3.87–5.11)
WBC: 4.7 10*3/uL (ref 4.0–10.5)

## 2013-03-23 MED ORDER — CIPROFLOXACIN HCL 500 MG PO TABS
500.0000 mg | ORAL_TABLET | Freq: Two times a day (BID) | ORAL | Status: DC
  Administered 2013-03-23: 500 mg via ORAL
  Filled 2013-03-23 (×6): qty 1

## 2013-03-23 MED ORDER — SENNA 8.6 MG PO TABS
1.0000 | ORAL_TABLET | ORAL | Status: AC
  Administered 2013-03-23: 8.6 mg via ORAL
  Filled 2013-03-23: qty 1

## 2013-03-23 MED ORDER — MAGNESIUM HYDROXIDE 400 MG/5ML PO SUSP
30.0000 mL | Freq: Once | ORAL | Status: AC
  Administered 2013-03-23: 30 mL via ORAL
  Filled 2013-03-23: qty 30

## 2013-03-23 MED ORDER — OXYCODONE-ACETAMINOPHEN 5-325 MG PO TABS
1.0000 | ORAL_TABLET | ORAL | Status: DC | PRN
  Administered 2013-03-23 (×2): 2 via ORAL
  Filled 2013-03-23 (×2): qty 2

## 2013-03-23 MED ORDER — ALPRAZOLAM 0.25 MG PO TABS
0.2500 mg | ORAL_TABLET | Freq: Once | ORAL | Status: AC
  Administered 2013-03-23: 0.25 mg via ORAL
  Filled 2013-03-23: qty 1

## 2013-03-23 NOTE — Progress Notes (Signed)
Patient c/o nausea -no emesis. Bathing self. Requests SL Zofran - notified- so given IV Zofran. Nausea started with getting breakfast. Ate 3/4 breakfast. Hypoactive BS,last BM x 5 days. Given MOM per order.

## 2013-03-23 NOTE — Progress Notes (Signed)
PATIENT DETAILS Name: Brandy Kidd Age: 22 y.o. Sex: female Date of Birth: 05-05-1991 Admit Date: 03/20/2013 Admitting Physician Eduard Clos, MD PCP:No PCP Per Patient  Subjective: Complaining about dizziness. This is likely secondary to pain medication +/- dehydration  Assessment/Plan: Principal Problem:   Right sided Pyelonephritis -clinically better-although not much oral intake as still feels nauseous -c/w Rocephin till oral intake stable-then likely can transition to oral antibiotics and discharge -Urine cx positive for pansensitive E Coli, Ultrasound abdomen-shows no hydronephrosis  Active Problems: Tobacco and marijuana abuse  - cessation counseling requested.  Dizziness -This is likely secondary to pain medication also modest dehydration. - I will start IV fluids, DC IV pain medications.  Disposition: Remain inpatient  DVT Prophylaxis: Prophylactic Lovenox  Code Status: Full code  Family Communication None  Procedures:  None  CONSULTS:  None   MEDICATIONS: Scheduled Meds: . cefTRIAXone (ROCEPHIN)  IV  1 g Intravenous Q24H  . enoxaparin (LOVENOX) injection  40 mg Subcutaneous Q24H   Continuous Infusions: . sodium chloride 100 mL/hr at 03/23/13 0445   PRN Meds:.acetaminophen, acetaminophen, ondansetron (ZOFRAN) IV, ondansetron, ondansetron, oxyCODONE-acetaminophen  Antibiotics: Anti-infectives   Start     Dose/Rate Route Frequency Ordered Stop   03/21/13 2200  cefTRIAXone (ROCEPHIN) 1 g in dextrose 5 % 50 mL IVPB     1 g 100 mL/hr over 30 Minutes Intravenous Every 24 hours 03/21/13 0535     03/21/13 0230  cefTRIAXone (ROCEPHIN) 1 g in dextrose 5 % 50 mL IVPB     1 g 100 mL/hr over 30 Minutes Intravenous  Once 03/21/13 0225 03/21/13 0344       PHYSICAL EXAM: Vital signs in last 24 hours: Filed Vitals:   03/22/13 1900 03/22/13 2128 03/22/13 2308 03/23/13 0615  BP: 118/70 115/81 110/71 101/59  Pulse: 92 93 92 71  Temp:  97.8 F  (36.6 C)  98.4 F (36.9 C)  TempSrc:  Oral  Oral  Resp:  18 24 18   SpO2:  100% 99% 98%    Weight change:  There were no vitals filed for this visit. There is no weight on file to calculate BMI.   Gen Exam: Awake and alert with clear speech.   Neck: Supple, No JVD.   Chest: B/L Clear.   CVS: S1 S2 Regular, no murmurs.  Abdomen: soft, BS +, non tender, non distended. Right CVA tender still Extremities: no edema, lower extremities warm to touch. Neurologic: Non Focal.   Skin: No Rash.   Wounds: N/A.    Intake/Output from previous day:  Intake/Output Summary (Last 24 hours) at 03/23/13 1252 Last data filed at 03/23/13 0600  Gross per 24 hour  Intake 1361.67 ml  Output      0 ml  Net 1361.67 ml     LAB RESULTS: CBC  Recent Labs Lab 03/19/13 0755 03/20/13 1958 03/21/13 0705 03/23/13 0530  WBC 13.4* 11.6* 5.8 4.7  HGB 13.7 14.4 12.1 12.4  HCT 39.1 41.3 35.0* 35.4*  PLT 255 244 196 241  MCV 89.5 89.4 88.8 89.4  MCH 31.4 31.2 30.7 31.3  MCHC 35.0 34.9 34.6 35.0  RDW 12.1 12.1 12.0 11.9  LYMPHSABS 2.5 0.8 1.2  --   MONOABS 1.0 0.9 0.5  --   EOSABS 0.1 0.0 0.0  --   BASOSABS 0.0 0.0 0.0  --     Chemistries   Recent Labs Lab 03/19/13 0755 03/20/13 1958 03/21/13 0705 03/23/13 0530  NA 135 132* 136 140  K 3.5  4.0 3.6 4.0  CL 99 94* 103 104  CO2 23 25 22 27   GLUCOSE 102* 125* 100* 94  BUN 12 7 7  5*  CREATININE 0.87 0.97 0.81 0.82  CALCIUM 9.2 9.6 8.4 9.3    CBG: No results found for this basename: GLUCAP,  in the last 168 hours  GFR The CrCl is unknown because both a height and weight (above a minimum accepted value) are required for this calculation.  Coagulation profile No results found for this basename: INR, PROTIME,  in the last 168 hours  Cardiac Enzymes No results found for this basename: CK, CKMB, TROPONINI, MYOGLOBIN,  in the last 168 hours  No components found with this basename: POCBNP,  No results found for this basename: DDIMER,  in  the last 72 hours No results found for this basename: HGBA1C,  in the last 72 hours No results found for this basename: CHOL, HDL, LDLCALC, TRIG, CHOLHDL, LDLDIRECT,  in the last 72 hours No results found for this basename: TSH, T4TOTAL, FREET3, T3FREE, THYROIDAB,  in the last 72 hours No results found for this basename: VITAMINB12, FOLATE, FERRITIN, TIBC, IRON, RETICCTPCT,  in the last 72 hours No results found for this basename: LIPASE, AMYLASE,  in the last 72 hours  Urine Studies No results found for this basename: UACOL, UAPR, USPG, UPH, UTP, UGL, UKET, UBIL, UHGB, UNIT, UROB, ULEU, UEPI, UWBC, URBC, UBAC, CAST, CRYS, UCOM, BILUA,  in the last 72 hours  MICROBIOLOGY: Recent Results (from the past 240 hour(s))  URINE CULTURE     Status: None   Collection Time    03/19/13  8:10 AM      Result Value Range Status   Specimen Description URINE, RANDOM   Final   Special Requests NONE   Final   Culture  Setup Time 03/19/2013 19:29   Final   Colony Count >=100,000 COLONIES/ML   Final   Culture ESCHERICHIA COLI   Final   Report Status 03/21/2013 FINAL   Final   Organism ID, Bacteria ESCHERICHIA COLI   Final    RADIOLOGY STUDIES/RESULTS: Dg Chest 2 View  03/15/2013   *RADIOLOGY REPORT*  Clinical Data: Shortness of breath.  CHEST - 2 VIEW  Comparison: None.  Findings: Paraspinous pulmonary vascularity are normal and the lungs are clear.  No osseous abnormality.  IMPRESSION: Normal chest.   Original Report Authenticated By: Francene Boyers, M.D.   US Renal  03/21/2013   *RADIOLOGY REPORT*  Clinical Data:  Flank pain.  RENAL/URINARY TRACT ULTRASOUND COMPLETE  Comparison:  None  Findings:  Right Kidney:  The right kidney measures 12.2 cm in length.  The kidney demonstrates normal size and configuration.  Unusually increased echogenicity at the medullary pyramids may reflect medullary sponge kidney, though a variety of other conditions could have a similar appearance.  No significant cortical  thinning is seen.  No hydronephrosis or calcification is identified.  No masses are seen.  Left Kidney:  The left kidney measures 11.0 cm in length.  The kidney demonstrates normal size and configuration.  Unusually increased echogenicity at the medullary pyramids may reflect medullary sponge kidney, though a variety of other conditions could have a similar appearance.  No significant cortical thinning is seen.  No hydronephrosis or calcification is identified.  No masses are seen.  Bladder:  The bladder is mildly distended and is unremarkable in appearance.  Bilateral ureteral jets are visualized.  IMPRESSION:  1.  No evidence of hydronephrosis; bilateral ureteral jets noted at the  bladder. 2.  Unusually increased echogenicity at the medullary pyramids is nonspecific; it could reflect medullary sponge kidney, though a variety of other conditions could have a similar appearance.   Original Report Authenticated By: Tonia Ghent, M.D.    Clint Lipps, MD  [facility] Pager:[phone]  If 7PM-7AM, please contact night-coverage [email or web address] Password [email or web address] 03/23/2013, 12:52 PM   LOS: 3 days

## 2013-03-23 NOTE — Progress Notes (Signed)
Pt extremely anxious and c/o SOB and chest pain in center of chest.  Vitals WNL.  Pt states that she's had panic attacks everyday for a week.  Remained w/ pt to help calm her and to assist w/ breathing exercises.  NP notified and order for Ativan received and administered.  Pt calmed and no longer anxious w/in 30 minutes.

## 2013-03-24 ENCOUNTER — Inpatient Hospital Stay (HOSPITAL_COMMUNITY): Payer: MEDICAID

## 2013-03-24 DIAGNOSIS — R079 Chest pain, unspecified: Secondary | ICD-10-CM | POA: Diagnosis present

## 2013-03-24 LAB — BASIC METABOLIC PANEL
CO2: 29 mEq/L (ref 19–32)
Calcium: 9.1 mg/dL (ref 8.4–10.5)
Potassium: 3.6 mEq/L (ref 3.5–5.1)
Sodium: 141 mEq/L (ref 135–145)

## 2013-03-24 MED ORDER — IOHEXOL 350 MG/ML SOLN
80.0000 mL | Freq: Once | INTRAVENOUS | Status: AC | PRN
  Administered 2013-03-24: 80 mL via INTRAVENOUS

## 2013-03-24 MED ORDER — CIPROFLOXACIN HCL 500 MG PO TABS: 500.0000 mg | ORAL_TABLET | Freq: Two times a day (BID) | ORAL | Status: DC

## 2013-03-24 MED ORDER — TRAMADOL HCL 50 MG PO TABS: 50.0000 mg | ORAL_TABLET | Freq: Four times a day (QID) | ORAL | Status: DC | PRN

## 2013-03-24 NOTE — Progress Notes (Signed)
Pt awake very anxious and restless complaining of SOB  , vital signs normal ,chest is clear no respiratory distress noted will continue to monitor

## 2013-03-24 NOTE — Progress Notes (Signed)
Dr. Thersa Salt  Will   Get chest Xray and he will be up tp see patient, I  Informed patient and she is on phone and  Okay for xray to be done .

## 2013-03-24 NOTE — Discharge Summary (Signed)
Physician Discharge Summary  Brandy Kidd ZOX:[id number] DOB: 30-May-1991 DOA: 03/20/2013  PCP: No PCP Per Patient  Admit date: 03/20/2013 Discharge date: 03/24/2013  Time spent: 40 minutes  Recommendations for Outpatient Follow-up:  1. Followup with primary care physician in one week  Discharge Diagnoses:  Principal Problem:   Pyelonephritis Active Problems:   Tobacco abuse   Chest pain   Discharge Condition: Stable condition  Diet recommendation: *Regular diet  History of present illness:  Brandy Kidd is a 22 y.o. female who had come to the ER 2 days ago with right flank pain and was diagnosed with pyelonephritis and was discharged on Bactrim and tramadol presents back with right flank pain and at this time has persistent nausea vomiting unable to keep anything. Patient states that she had not filled her medications because she was unable to afford it. At this time patient has been admitted for IV antibiotics. Renal sonogram does not show any hydronephrosis. On exam patient does have right flank tenderness. Patient also has a mild cellulitis around the umbilicus where she had a ring.  Hospital Course:   1. Pyelonephritis , acute: Right-sided pyelonephritis probably from right flank pain. Patient as mentioned above was seen in the emergency department on 03/19/2013 and diagnosed with the pyelonephritis and she was sent home. Patient came in on the 27th complaining about nausea and vomiting she can take her oral medications. Patient urine was still positive for UTI, she was admitted to the hospital for further evaluation. She was started on IV Rocephin, and her diet advanced as she tolerates. Patient urine culture showed pansensitive Escherichia coli, her medication was switched to oral ciprofloxacin. On discharge ciprofloxacin for 10 more days was prescribed to complete 14 days of antibiotics. Tramadol given for pain control, only 20 pills prescribed  2. Tobacco abuse: She was counseled  extensively.  3. Chest pain: While patient was in the hospital she reported some chest pain, she also reported that she has history of panic attacks and she requested IV Ativan in name. I have seen her with nurse Mrs. [employer] at bedside. She did have what she called a panic attack, to me that did not look like panic attack, patient calmed down in about 1 minute. I did CT angio of her chest which showed no evidence of acute abnormalities.  Procedures:  None  Consultations:  None  Discharge Exam: Filed Vitals:   03/23/13 2105 03/23/13 2253 03/24/13 0637 03/24/13 0832  BP: 115/71 132/83 136/72 121/73  Pulse: 70  75 74  Temp: 98.1 F (36.7 C) 97.6 F (36.4 C) 97.1 F (36.2 C)   TempSrc:  Oral    Resp: 18  18 18   SpO2: 99% 100% 100% 97%   General: Alert and awake, oriented x3, not in any acute distress. HEENT: anicteric sclera, pupils reactive to light and accommodation, EOMI CVS: S1-S2 clear, no murmur rubs or gallops Chest: clear to auscultation bilaterally, no wheezing, rales or rhonchi Abdomen: soft nontender, nondistended, normal bowel sounds, no organomegaly Extremities: no cyanosis, clubbing or edema noted bilaterally Neuro: Cranial nerves II-XII intact, no focal neurological deficits  Discharge Instructions  Discharge Orders   Future Orders Complete By Expires     Diet - low sodium heart healthy  As directed     Increase activity slowly  As directed         Medication List    STOP taking these medications       ADVIL PM PO     sulfamethoxazole-trimethoprim  800-160 MG per tablet  Commonly known as:  SEPTRA DS      TAKE these medications       ciprofloxacin 500 MG tablet  Commonly known as:  CIPRO  Take 1 tablet (500 mg total) by mouth 2 (two) times daily.     traMADol 50 MG tablet  Commonly known as:  ULTRAM  Take 1 tablet (50 mg total) by mouth every 6 (six) hours as needed for pain.       No Known Allergies    The results of significant  diagnostics from this hospitalization (including imaging, microbiology, ancillary and laboratory) are listed below for reference.    Significant Diagnostic Studies: Dg Chest 2 View  03/15/2013   *RADIOLOGY REPORT*  Clinical Data: Shortness of breath.  CHEST - 2 VIEW  Comparison: None.  Findings: Paraspinous pulmonary vascularity are normal and the lungs are clear.  No osseous abnormality.  IMPRESSION: Normal chest.   Original Report Authenticated By: Francene Boyers, M.D.   Ct Angio Chest Pe W/cm &/or Wo Cm  03/24/2013   *RADIOLOGY REPORT*  Clinical Data: Shortness of breath, anxiety  CT ANGIOGRAPHY CHEST  Technique:  Multidetector CT imaging of the chest using the standard protocol during bolus administration of intravenous contrast. Multiplanar reconstructed images including MIPs were obtained and reviewed to evaluate the vascular anatomy.  Contrast: 80mL OMNIPAQUE IOHEXOL 350 MG/ML SOLN  Comparison: 09/02/2010 chest x-ray  Findings: No filling defect or significant acute pulmonary embolus within the pulmonary arteries by CTA.  Thoracic aorta appears intact.  Negative for aneurysm or dissection.  Motion artifact noted of the ascending aorta.  Normal heart size.  No pericardial or pleural effusion.  No hernia.  No adenopathy.  Included upper abdomen demonstrates no acute process.  Lung windows demonstrate motion artifact as well.  No airspace process, collapse, consolidation, interstitial change, suspicious pulmonary nodule or mass.  Trachea and central airways appear patent.  No osseous abnormality.  Motion artifact noted of the sternal region.  IMPRESSION: No significant acute pulmonary embolus.  No acute intrathoracic process.   Original Report Authenticated By: Judie Petit. Miles Costain, M.D.   US Renal  03/21/2013   *RADIOLOGY REPORT*  Clinical Data:  Flank pain.  RENAL/URINARY TRACT ULTRASOUND COMPLETE  Comparison:  None  Findings:  Right Kidney:  The right kidney measures 12.2 cm in length.  The kidney demonstrates  normal size and configuration.  Unusually increased echogenicity at the medullary pyramids may reflect medullary sponge kidney, though a variety of other conditions could have a similar appearance.  No significant cortical thinning is seen.  No hydronephrosis or calcification is identified.  No masses are seen.  Left Kidney:  The left kidney measures 11.0 cm in length.  The kidney demonstrates normal size and configuration.  Unusually increased echogenicity at the medullary pyramids may reflect medullary sponge kidney, though a variety of other conditions could have a similar appearance.  No significant cortical thinning is seen.  No hydronephrosis or calcification is identified.  No masses are seen.  Bladder:  The bladder is mildly distended and is unremarkable in appearance.  Bilateral ureteral jets are visualized.  IMPRESSION:  1.  No evidence of hydronephrosis; bilateral ureteral jets noted at the bladder. 2.  Unusually increased echogenicity at the medullary pyramids is nonspecific; it could reflect medullary sponge kidney, though a variety of other conditions could have a similar appearance.   Original Report Authenticated By: Tonia Ghent, M.D.    Microbiology: Recent Results (from the past 240 hour(s))  URINE CULTURE     Status: None   Collection Time    03/19/13  8:10 AM      Result Value Range Status   Specimen Description URINE, RANDOM   Final   Special Requests NONE   Final   Culture  Setup Time 03/19/2013 19:29   Final   Colony Count >=100,000 COLONIES/ML   Final   Culture ESCHERICHIA COLI   Final   Report Status 03/21/2013 FINAL   Final   Organism ID, Bacteria ESCHERICHIA COLI   Final     Labs: Basic Metabolic Panel:  Recent Labs Lab 03/19/13 0755 03/20/13 1958 03/21/13 0705 03/23/13 0530 03/24/13 0500  NA 135 132* 136 140 141  K 3.5 4.0 3.6 4.0 3.6  CL 99 94* 103 104 102  CO2 23 25 22 27 29   GLUCOSE 102* 125* 100* 94 93  BUN 12 7 7  5* 5*  CREATININE 0.87 0.97 0.81  0.82 0.76  CALCIUM 9.2 9.6 8.4 9.3 9.1   Liver Function Tests:  Recent Labs Lab 03/20/13 1958 03/21/13 0705  AST 12 14  ALT 8 8  ALKPHOS 51 40  BILITOT 0.6 0.2*  PROT 7.8 6.4  ALBUMIN 4.1 3.1*   No results found for this basename: LIPASE, AMYLASE,  in the last 168 hours No results found for this basename: AMMONIA,  in the last 168 hours CBC:  Recent Labs Lab 03/19/13 0755 03/20/13 1958 03/21/13 0705 03/23/13 0530  WBC 13.4* 11.6* 5.8 4.7  NEUTROABS 9.8* 9.9* 4.1  --   HGB 13.7 14.4 12.1 12.4  HCT 39.1 41.3 35.0* 35.4*  MCV 89.5 89.4 88.8 89.4  PLT 255 244 196 241   Cardiac Enzymes: No results found for this basename: CKTOTAL, CKMB, CKMBINDEX, TROPONINI,  in the last 168 hours BNP: BNP (last 3 results) No results found for this basename: PROBNP,  in the last 8760 hours CBG: No results found for this basename: GLUCAP,  in the last 168 hours     Signed:  [facility]  [facility] 03/24/2013, 1:36 PM

## 2013-03-24 NOTE — Progress Notes (Signed)
SS Enema given as good results

## 2013-03-24 NOTE — Progress Notes (Signed)
Pt has called at least five times this am , crying and seemly upset  With her care and response will call MD to follow up with her concerns and management is aware.

## 2013-03-24 NOTE — Progress Notes (Signed)
At 2250, patient called me into her room with c/o difficulty breathing, she was extremely anxious.  Vitals stable T 97.6 P83 R20 BP 132/83 O2 sat 100% RA.  I received an order for Ativan 0.25mg  and gave medication.   Then patient c/o of her teething was bleeding when brushing. I assessed her mouth and I suggested softer strokes with the tooth brush.

## 2013-03-24 NOTE — Progress Notes (Signed)
At 0200 patient wanted snacks however she did not want the snacks that we provide on the unit.  She wanted to walk to the vending machine.  I took patient to the vending machine via wheelchair.  Then patient stated she only had a ten dollar bill.  I took her back to the nursing station and asked staff if they had change.  No one had change for a ten dollar bill.  Then patient stated that she found singles in her wallet and wanted to return to the vending machine.  She received items from the vending machine and then requested food items that we have on the unit.  I took patient back to her room and placed bed alarm on.

## 2013-03-25 NOTE — Care Management Note (Signed)
CARE MANAGEMENT NOTE 03/25/2013  Patient:  Brandy Kidd, Brandy Kidd   Account Number:  [id number]  Date Initiated:  03/24/2013  Documentation initiated by:  Vance Peper  Subjective/Objective Assessment:   22 yr old female admitted for pyelnephritis.     Action/Plan:   Patient needs appointment at the [facility].  CM contacted Diplomatic Services operational officer at [employer]. they will contact patient to arrange appointment. confirmed contact information.   Anticipated DC Date:  03/24/2013   Anticipated DC Plan:  HOME/SELF CARE      DC Planning Services  CM consult  Follow-up appt scheduled      Choice offered to / List presented to:             Status of service:  Completed, signed off Medicare Important Message given?   (If response is "NO", the following Medicare IM given date fields will be blank) Date Medicare IM given:   Date Additional Medicare IM given:    Discharge Disposition:  HOME/SELF CARE  Per UR Regulation:    If discussed at Long Length of Stay Meetings, dates discussed:    Comments:

## 2013-06-30 ENCOUNTER — Emergency Department (HOSPITAL_COMMUNITY): Payer: Self-pay

## 2013-06-30 ENCOUNTER — Emergency Department (HOSPITAL_COMMUNITY)
Admission: EM | Admit: 2013-06-30 | Discharge: 2013-06-30 | Disposition: A | Discharge status: Home / Self Care | Payer: Self-pay | Attending: Emergency Medicine | Admitting: Emergency Medicine

## 2013-06-30 ENCOUNTER — Encounter (HOSPITAL_COMMUNITY): Payer: Self-pay | Admitting: Emergency Medicine

## 2013-06-30 DIAGNOSIS — Z8619 Personal history of other infectious and parasitic diseases: Secondary | ICD-10-CM | POA: Insufficient documentation

## 2013-06-30 DIAGNOSIS — Y929 Unspecified place or not applicable: Secondary | ICD-10-CM | POA: Insufficient documentation

## 2013-06-30 DIAGNOSIS — R000 Tachycardia, unspecified: Secondary | ICD-10-CM | POA: Insufficient documentation

## 2013-06-30 DIAGNOSIS — S239XXA Sprain of unspecified parts of thorax, initial encounter: Secondary | ICD-10-CM | POA: Insufficient documentation

## 2013-06-30 DIAGNOSIS — Y939 Activity, unspecified: Secondary | ICD-10-CM | POA: Insufficient documentation

## 2013-06-30 DIAGNOSIS — Z87891 Personal history of nicotine dependence: Secondary | ICD-10-CM | POA: Insufficient documentation

## 2013-06-30 DIAGNOSIS — X58XXXA Exposure to other specified factors, initial encounter: Secondary | ICD-10-CM | POA: Insufficient documentation

## 2013-06-30 DIAGNOSIS — T148XXA Other injury of unspecified body region, initial encounter: Secondary | ICD-10-CM

## 2013-06-30 LAB — URINALYSIS, ROUTINE W REFLEX MICROSCOPIC
Glucose, UA: NEGATIVE mg/dL
Protein, ur: NEGATIVE mg/dL
Specific Gravity, Urine: 1.024 (ref 1.005–1.030)
Urobilinogen, UA: 0.2 mg/dL (ref 0.0–1.0)

## 2013-06-30 LAB — URINE MICROSCOPIC-ADD ON

## 2013-06-30 MED ORDER — METHOCARBAMOL 500 MG PO TABS
750.0000 mg | ORAL_TABLET | Freq: Once | ORAL | Status: AC
  Administered 2013-06-30: 750 mg via ORAL
  Filled 2013-06-30 (×2): qty 1

## 2013-06-30 MED ORDER — METHOCARBAMOL 750 MG PO TABS: 750.0000 mg | ORAL_TABLET | Freq: Four times a day (QID) | ORAL | Status: DC

## 2013-06-30 MED ORDER — IBUPROFEN 800 MG PO TABS
800.0000 mg | ORAL_TABLET | Freq: Once | ORAL | Status: AC
  Administered 2013-06-30: 800 mg via ORAL
  Filled 2013-06-30: qty 1

## 2013-06-30 MED ORDER — IBUPROFEN 600 MG PO TABS: 600.0000 mg | ORAL_TABLET | Freq: Four times a day (QID) | ORAL | Status: DC | PRN

## 2013-06-30 NOTE — ED Provider Notes (Signed)
CSN: [id number]     Arrival date & time 06/30/13  0840 History   First MD Initiated Contact with Patient 06/30/13 [phone]     Chief Complaint  Patient presents with  . Back Pain   (Consider location/radiation/quality/duration/timing/severity/associated sxs/prior Treatment) Patient is a 22 y.o. female presenting with back pain. The history is provided by the patient.  Back Pain  patient here with left-sided thoracic paraspinal back pain that woke up this morning. Pain is pinpoint location and worse with movement. Denies any pleuritic component to this. Pain characterized as being sore and also worse when she moves her arm. No prior history of same. Denies any recent heavy lifting. Symptoms have been persistent and no treatment used prior to arrival.  Past Medical History  Diagnosis Date  . No pertinent past medical history   . HPV in female   . Chlamydia   . Gonorrhea   . Abnormal Pap smear    Past Surgical History  Procedure Laterality Date  . No past surgeries     Family History  Problem Relation Age of Onset  . Alcohol abuse Mother   . Depression Mother   . Drug abuse Mother   . Heart disease Mother     mother had a closed valve and had heart surgery  . Alcohol abuse Father   . Drug abuse Father    History  Substance Use Topics  . Smoking status: Former Smoker -- 0.00 packs/day    Types: Cigarettes  . Smokeless tobacco: Never Used  . Alcohol Use: Yes     Comment: 1-2 glasses of wine per week   OB History   Grav Para Term Preterm Abortions TAB SAB Ect Mult Living   3 1 1  2  2   1      Review of Systems  Musculoskeletal: Positive for back pain.  All other systems reviewed and are negative.    Allergies  Review of patient's allergies indicates no known allergies.  Home Medications   Current Outpatient Rx  Name  Route  Sig  Dispense  Refill  . ciprofloxacin (CIPRO) 500 MG tablet   Oral   Take 1 tablet (500 mg total) by mouth 2 (two) times daily.   20 tablet  0   . traMADol (ULTRAM) 50 MG tablet   Oral   Take 1 tablet (50 mg total) by mouth every 6 (six) hours as needed for pain.   20 tablet   0    BP 118/77  Pulse 105  Temp(Src) 97.9 F (36.6 C) (Oral)  Resp 18  SpO2 100%  LMP 06/23/2013 Physical Exam  Nursing note and vitals reviewed. Constitutional: She is oriented to person, place, and time. She appears well-developed and well-nourished.  Non-toxic appearance. No distress.  HENT:  Head: Normocephalic and atraumatic.  Eyes: Conjunctivae, EOM and lids are normal. Pupils are equal, round, and reactive to light.  Neck: Normal range of motion. Neck supple. No tracheal deviation present. No mass present.  Cardiovascular: Regular rhythm and normal heart sounds.  Tachycardia present.  Exam reveals no gallop.   No murmur heard. Pulmonary/Chest: Effort normal and breath sounds normal. No stridor. No respiratory distress. She has no decreased breath sounds. She has no wheezes. She has no rhonchi. She has no rales.  Abdominal: Soft. Normal appearance and bowel sounds are normal. She exhibits no distension. There is no tenderness. There is no rebound and no CVA tenderness.  Musculoskeletal: Normal range of motion. She exhibits no edema and  no tenderness.       Arms: Neurological: She is alert and oriented to person, place, and time. She has normal strength. No cranial nerve deficit or sensory deficit. GCS eye subscore is 4. GCS verbal subscore is 5. GCS motor subscore is 6.  Skin: Skin is warm and dry. No abrasion and no rash noted.  Psychiatric: She has a normal mood and affect. Her speech is normal and behavior is normal.    ED Course  Procedures (including critical care time) Labs Review Labs Reviewed  URINALYSIS, ROUTINE W REFLEX MICROSCOPIC   Imaging Review No results found.  MDM  No diagnosis found. Patient negative chest x-ray here. Given Motrin and feels better. Suspect musculoskeletal back pain and she is stable for  discharge    Toy Baker, MD 06/30/13 1018

## 2013-06-30 NOTE — ED Notes (Signed)
Per pt, woke up with left upper back pain-unable to left arms above head

## 2013-06-30 NOTE — Progress Notes (Signed)
[facility] provided pt with a list of primary care resources.  °

## 2013-07-03 LAB — URINE CULTURE

## 2013-07-04 NOTE — ED Notes (Signed)
Asymptomatic bacteria-no treatment indicated.

## 2013-07-04 NOTE — Progress Notes (Signed)
ED Antimicrobial Stewardship Positive Culture Follow Up   Brandy Kidd is an 22 y.o. female who presented to [facility] on 06/30/2013 with a chief complaint of  Chief Complaint  Patient presents with  . Back Pain    Recent Results (from the past 720 hour(s))  URINE CULTURE     Status: None   Collection Time    06/30/13  9:08 AM      Result Value Range Status   Specimen Description URINE, CLEAN CATCH   Final   Special Requests NONE   Final   Culture  Setup Time     Final   Value: 06/30/2013 14:09     Performed at [facility] Count     Final   Value: >=100,000 COLONIES/ML     Performed at [facility]   Culture     Final   Value: ESCHERICHIA COLI     Performed at [facility]   Report Status 07/03/2013 FINAL   Final   Organism ID, Bacteria ESCHERICHIA COLI   Final   Patient presented with back pain. No UTI symptoms reported. Asymptomatic bacteruria - no treatment indicated at this time.    ED Provider: Rhea Bleacher, PA-C   Cleon Dew 07/04/2013, 4:42 PM Infectious Diseases Pharmacist Phone# [phone]

## 2013-07-16 ENCOUNTER — Emergency Department (HOSPITAL_COMMUNITY)
Admission: EM | Admit: 2013-07-16 | Discharge: 2013-07-17 | Disposition: A | Discharge status: Home / Self Care | Payer: Self-pay | Attending: Emergency Medicine | Admitting: Emergency Medicine

## 2013-07-16 ENCOUNTER — Encounter (HOSPITAL_COMMUNITY): Payer: Self-pay | Admitting: Emergency Medicine

## 2013-07-16 DIAGNOSIS — Z3202 Encounter for pregnancy test, result negative: Secondary | ICD-10-CM | POA: Insufficient documentation

## 2013-07-16 DIAGNOSIS — Z8619 Personal history of other infectious and parasitic diseases: Secondary | ICD-10-CM | POA: Insufficient documentation

## 2013-07-16 DIAGNOSIS — Z87891 Personal history of nicotine dependence: Secondary | ICD-10-CM | POA: Insufficient documentation

## 2013-07-16 DIAGNOSIS — N72 Inflammatory disease of cervix uteri: Secondary | ICD-10-CM | POA: Insufficient documentation

## 2013-07-16 DIAGNOSIS — R350 Frequency of micturition: Secondary | ICD-10-CM | POA: Insufficient documentation

## 2013-07-16 DIAGNOSIS — R300 Dysuria: Secondary | ICD-10-CM | POA: Insufficient documentation

## 2013-07-16 LAB — POCT PREGNANCY, URINE: Preg Test, Ur: NEGATIVE

## 2013-07-16 NOTE — ED Notes (Signed)
Pt presents to the ED with a complaint of abdominal pain.  Pt states she is also having frequent urination.  Pt states that she has no burning upon urination.  Pt is complaining of lower abdominal pain.

## 2013-07-16 NOTE — ED Notes (Addendum)
PT DID NOT PROVIDE ENOUGH URINE FOR URINALYSIS. WILL NEED PT TO RECOLLECT. PT INFORMED OF NEED TO RECOLLECT URINE SPECIIMEN

## 2013-07-17 LAB — CBC WITH DIFFERENTIAL/PLATELET
Basophils Absolute: 0 10*3/uL (ref 0.0–0.1)
Eosinophils Relative: 2 % (ref 0–5)
Lymphocytes Relative: 38 % (ref 12–46)
Neutro Abs: 4.7 10*3/uL (ref 1.7–7.7)
Neutrophils Relative %: 54 % (ref 43–77)
Platelets: 280 10*3/uL (ref 150–400)
RDW: 11.9 % (ref 11.5–15.5)
WBC: 8.8 10*3/uL (ref 4.0–10.5)

## 2013-07-17 LAB — WET PREP, GENITAL
Trich, Wet Prep: NONE SEEN
Yeast Wet Prep HPF POC: NONE SEEN

## 2013-07-17 LAB — COMPREHENSIVE METABOLIC PANEL
ALT: 9 U/L (ref 0–35)
AST: 14 U/L (ref 0–37)
Albumin: 3.9 g/dL (ref 3.5–5.2)
CO2: 26 mEq/L (ref 19–32)
Calcium: 9.6 mg/dL (ref 8.4–10.5)
Creatinine, Ser: 0.78 mg/dL (ref 0.50–1.10)
GFR calc non Af Amer: >90 mL/min (ref >90)
Potassium: 3.5 mEq/L (ref 3.5–5.1)
Sodium: 139 mEq/L (ref 135–145)
Total Protein: 7.2 g/dL (ref 6.0–8.3)

## 2013-07-17 LAB — URINALYSIS, ROUTINE W REFLEX MICROSCOPIC
Nitrite: NEGATIVE
Protein, ur: NEGATIVE mg/dL
Urobilinogen, UA: 0.2 mg/dL (ref 0.0–1.0)

## 2013-07-17 LAB — URINE MICROSCOPIC-ADD ON

## 2013-07-17 LAB — LIPASE, BLOOD: Lipase: 33 U/L (ref 11–59)

## 2013-07-17 MED ORDER — CEFTRIAXONE SODIUM 250 MG IJ SOLR: 250.0000 mg | Freq: Once | INTRAMUSCULAR | Status: DC

## 2013-07-17 MED ORDER — DOXYCYCLINE HYCLATE 100 MG PO CAPS: 100.0000 mg | ORAL_CAPSULE | Freq: Two times a day (BID) | ORAL | Status: DC

## 2013-07-17 MED ORDER — AZITHROMYCIN 250 MG PO TABS
1000.0000 mg | ORAL_TABLET | Freq: Once | ORAL | Status: AC
  Administered 2013-07-17: 1000 mg via ORAL
  Filled 2013-07-17: qty 4

## 2013-07-17 NOTE — ED Notes (Signed)
Patient and significant other arguing loudly in the hallway. Patient left ER treatment area before being evaluated by ED doctor.

## 2013-07-17 NOTE — ED Notes (Addendum)
After patient signed her discharge instructions and rudely states "I didn't get anything for pain and that is why I came here. I still have the pain." Attending Physician Assistant has completed his shift and has left the ED treatment area.

## 2013-07-17 NOTE — ED Notes (Addendum)
Dr. Preston Fleeting notified of the patient's request for pain medication.

## 2013-07-17 NOTE — ED Provider Notes (Signed)
Medical screening examination/treatment/procedure(s) were performed by non-physician practitioner and as supervising physician I was immediately available for consultation/collaboration.   Dione Booze, MD 07/17/13 [phone]

## 2013-07-17 NOTE — ED Notes (Signed)
Pt come to the triage desk and registration multiple times, rude to staff, yelled at registration due to having to wait for room. Pt has been in lobby texting without s/s of distress.

## 2013-07-17 NOTE — ED Provider Notes (Signed)
CSN: [id number]     Arrival date & time 07/16/13  2304 History   First MD Initiated Contact with Patient 07/17/13 0007     Chief Complaint  Patient presents with  . Abdominal Pain   (Consider location/radiation/quality/duration/timing/severity/associated sxs/prior Treatment) HPI  22 year old female with history of STD presents complaining of abdominal pain. Patient reports for the past 10 days she has had intermittent low abnormal pain. Pain is described as a sharp sensation, shooting, lasting only for seconds. She reports having frequent urination without burning urinations or blood in the urine. No dizziness makes symptoms better or worse. She denies fever, chills, chest pain, shortness of breath, back pain, nausea, vomiting, diarrhea, pain with sexual activities, vaginal discharge, or rash. She had her bellybutton pierced week ago and does report some tenderness to the site. She is a G1 P1. Her last menstrual period was August 28.  Past Medical History  Diagnosis Date  . No pertinent past medical history   . HPV in female   . Chlamydia   . Gonorrhea   . Abnormal Pap smear    Past Surgical History  Procedure Laterality Date  . No past surgeries     Family History  Problem Relation Age of Onset  . Alcohol abuse Mother   . Depression Mother   . Drug abuse Mother   . Heart disease Mother     mother had a closed valve and had heart surgery  . Alcohol abuse Father   . Drug abuse Father    History  Substance Use Topics  . Smoking status: Former Smoker -- 0.00 packs/day    Types: Cigarettes  . Smokeless tobacco: Never Used  . Alcohol Use: Yes     Comment: 1-2 glasses of wine per week   OB History   Grav Para Term Preterm Abortions TAB SAB Ect Mult Living   3 1 1  2  2   1      Review of Systems  Constitutional: Negative for fever.  Respiratory: Negative for shortness of breath.   Gastrointestinal: Positive for abdominal pain.  Genitourinary: Positive for dysuria and  frequency. Negative for vaginal bleeding and vaginal discharge.  Skin: Negative for rash.  All other systems reviewed and are negative.    Allergies  Lorazepam  Home Medications  No current outpatient prescriptions on file. BP 117/75  Pulse 97  Temp(Src) 98.7 F (37.1 C) (Oral)  Resp 18  Ht 5\' 5"  (1.651 m)  Wt 144 lb 3.2 oz (65.409 kg)  BMI 24 kg/m2  SpO2 99%  LMP 06/22/2013  Breastfeeding? No Physical Exam  Nursing note and vitals reviewed. Constitutional: She appears well-developed and well-nourished. No distress.  HENT:  Head: Normocephalic and atraumatic.  Eyes: Conjunctivae are normal.  Neck: Normal range of motion. Neck supple.  Cardiovascular: Normal rate and regular rhythm.   Pulmonary/Chest: Effort normal and breath sounds normal. She exhibits no tenderness.  Abdominal: Soft. There is tenderness (mild suprapubic tenderness without guarding or rebound tenderness  Pt has an belly button pierced with localized skin irration and mild tenderness without obvious signs of infection. ).  Genitourinary: Vagina normal and uterus normal. There is no rash or lesion on the right labia. There is no rash or lesion on the left labia. Cervix exhibits motion tenderness. Cervix exhibits no discharge. Right adnexum displays tenderness. Right adnexum displays no mass. Left adnexum displays tenderness. Left adnexum displays no mass. No erythema, tenderness or bleeding around the vagina. No vaginal discharge found.  Chaperone  present:  Lymphadenopathy:       Right: No inguinal adenopathy present.       Left: No inguinal adenopathy present.    ED Course  Procedures (including critical care time)  Pt here with dysuria and lower abdominal pain.  Pelvic exam remarkable for left/right adnexal tenderness and CMT.  Wet prep with moderate WBC.  Pt has prior hx of STD.  I offer rocephin/zithromax. Pt decline Rocephin IM and request PO pills instead.  Will awaits UA result, but will d/c with  doxycycline x 7 days.  Otherwise, she appears to be in NAD, nontoxic, normal WBC, afebrile, negative pregnancy.    1:18 AM UA neg for UTI.  Will d/c with doxy.  Return precaution discussed.    Labs Review Labs Reviewed  WET PREP, GENITAL - Abnormal; Notable for the following:    Clue Cells Wet Prep HPF POC RARE (*)    WBC, Wet Prep HPF POC MODERATE (*)    All other components within normal limits  URINALYSIS, ROUTINE W REFLEX MICROSCOPIC - Abnormal; Notable for the following:    Leukocytes, UA TRACE (*)    All other components within normal limits  CBC WITH DIFFERENTIAL - Abnormal; Notable for the following:    MCHC 36.1 (*)    All other components within normal limits  COMPREHENSIVE METABOLIC PANEL - Abnormal; Notable for the following:    Glucose, Bld 109 (*)    Total Bilirubin 0.2 (*)    All other components within normal limits  URINE MICROSCOPIC-ADD ON - Abnormal; Notable for the following:    Squamous Epithelial / LPF FEW (*)    All other components within normal limits  GC/CHLAMYDIA PROBE AMP  LIPASE, BLOOD  POCT PREGNANCY, URINE   Imaging Review No results found.  MDM   1. Cervicitis    BP 117/75  Pulse 97  Temp(Src) 98.7 F (37.1 C) (Oral)  Resp 18  Ht 5\' 5"  (1.651 m)  Wt 144 lb 3.2 oz (65.409 kg)  BMI 24 kg/m2  SpO2 99%  LMP 06/22/2013  Breastfeeding? No  I have reviewed nursing notes and vital signs.  I reviewed available ER/hospitalization records thought the EMR     Fayrene Helper, [place] 07/17/13 0119

## 2013-07-17 NOTE — ED Notes (Signed)
Patient asks if she is pregnant. Informed patient that her urine preg test was NEGATIVE. Patient states that she had unprotected sex last Sunday and that she is trying to get pregnant.

## 2013-07-18 LAB — GC/CHLAMYDIA PROBE AMP
CT Probe RNA: NEGATIVE
GC Probe RNA: NEGATIVE

## 2013-07-20 ENCOUNTER — Inpatient Hospital Stay (HOSPITAL_COMMUNITY)
Admission: AD | Admit: 2013-07-20 | Discharge: 2013-07-20 | Disposition: A | Discharge status: Home / Self Care | Payer: Self-pay | Source: Ambulatory Visit | Attending: Obstetrics and Gynecology | Admitting: Obstetrics and Gynecology

## 2013-07-20 ENCOUNTER — Inpatient Hospital Stay (HOSPITAL_COMMUNITY): Payer: Self-pay

## 2013-07-20 DIAGNOSIS — N390 Urinary tract infection, site not specified: Secondary | ICD-10-CM | POA: Insufficient documentation

## 2013-07-20 DIAGNOSIS — R42 Dizziness and giddiness: Secondary | ICD-10-CM | POA: Insufficient documentation

## 2013-07-20 DIAGNOSIS — R51 Headache: Secondary | ICD-10-CM | POA: Insufficient documentation

## 2013-07-20 DIAGNOSIS — R109 Unspecified abdominal pain: Secondary | ICD-10-CM | POA: Insufficient documentation

## 2013-07-20 DIAGNOSIS — Z3202 Encounter for pregnancy test, result negative: Secondary | ICD-10-CM | POA: Insufficient documentation

## 2013-07-20 LAB — URINALYSIS, ROUTINE W REFLEX MICROSCOPIC
Bilirubin Urine: NEGATIVE
Glucose, UA: NEGATIVE mg/dL
Ketones, ur: NEGATIVE mg/dL
Leukocytes, UA: NEGATIVE
Nitrite: NEGATIVE
Protein, ur: NEGATIVE mg/dL
Specific Gravity, Urine: 1.025 (ref 1.005–1.030)
Urobilinogen, UA: 0.2 mg/dL (ref 0.0–1.0)
pH: 6 (ref 5.0–8.0)

## 2013-07-20 LAB — URINE MICROSCOPIC-ADD ON

## 2013-07-20 LAB — RAPID URINE DRUG SCREEN, HOSP PERFORMED
Opiates: NOT DETECTED
Tetrahydrocannabinol: NOT DETECTED

## 2013-07-20 LAB — CBC
HCT: 39 % (ref 36.0–46.0)
Hemoglobin: 13.7 g/dL (ref 12.0–15.0)
MCH: 30.6 pg (ref 26.0–34.0)
Platelets: 292 10*3/uL (ref 150–400)
RBC: 4.47 MIL/uL (ref 3.87–5.11)
RDW: 11.9 % (ref 11.5–15.5)
WBC: 10.3 10*3/uL (ref 4.0–10.5)

## 2013-07-20 LAB — GLUCOSE, CAPILLARY: Glucose-Capillary: 97 mg/dL (ref 70–99)

## 2013-07-20 MED ORDER — KETOROLAC TROMETHAMINE 10 MG PO TABS
10.0000 mg | ORAL_TABLET | Freq: Once | ORAL | Status: AC
  Administered 2013-07-20: 10 mg via ORAL
  Filled 2013-07-20: qty 1

## 2013-07-20 MED ORDER — BUTALBITAL-APAP-CAFFEINE 50-325-40 MG PO TABS: 1.0000 | ORAL_TABLET | Freq: Four times a day (QID) | ORAL | Status: DC | PRN

## 2013-07-20 MED ORDER — CEPHALEXIN 500 MG PO CAPS: 500.0000 mg | ORAL_CAPSULE | Freq: Four times a day (QID) | ORAL | Status: AC

## 2013-07-20 MED ORDER — PROMETHAZINE HCL 25 MG PO TABS
12.5000 mg | ORAL_TABLET | Freq: Four times a day (QID) | ORAL | Status: DC | PRN
Start: 2013-07-20 — End: 2014-02-08

## 2013-07-20 MED ORDER — ONDANSETRON 8 MG PO TBDP
8.0000 mg | ORAL_TABLET | Freq: Once | ORAL | Status: AC
  Administered 2013-07-20: 8 mg via ORAL
  Filled 2013-07-20: qty 1

## 2013-07-20 NOTE — MAU Note (Signed)
Dizziness since 2pm today. Some low abdominal pain and back pain. Denies vaginal discharge or vaginal bleeding. LMP 8/28. No birth control. States has had unprotected sex so unsure if pregnant.

## 2013-07-20 NOTE — MAU Note (Signed)
Pt felt dizzy since 1330 on 07/19/13 and has been feeling sharp pain in her back and lower abdomen for about a week.

## 2013-07-20 NOTE — MAU Provider Note (Signed)
Chief Complaint: Dizziness and Abdominal Pain   First Provider Initiated Contact with Patient 07/20/13 0129     SUBJECTIVE HPI: Brandy Kidd is a 22 y.o. G3P1021 at Unknown by LMP who presents with low abdominal pain left greater than right, middle and dizziness since this afternoon. Wants to know she's pregnant. Had unprotected intercourse 11 days ago. Has not taken home pregnancy tests.Patient's last menstrual period was 06/22/2013. emergency room visit 07/08/2013 GC chlamydia will wet prep negative was treated for presumptive PID, but patient did not complete course of Doxy and had refused Rocephin injection.  Urine culture positive Escherichia coli that ED visit, but patient not treated because she was asymptomatic.  Past Medical History  Diagnosis Date  . HPV in female   . Chlamydia   . Gonorrhea   . Abnormal Pap smear    OB History  Gravida Para Term Preterm AB SAB TAB Ectopic Multiple Living  3 1 1  2 2    1     # Outcome Date GA Lbr Len/2nd Weight Sex Delivery Anes PTL Lv  3 TRM 08/20/11 [redacted]w[redacted]d 01:00 / 01:32 3.005 kg (6 lb 10 oz) F SVD EPI  Y  2 SAB 2011    U         Comments: pt did not receive + pregnancy test.  Pt stated "thought I lost the baby because I was bleeding"  1 SAB 2010    U    N     Comments: pt did not have a + pregnancy test.  Just assuming she was pregnant and miscarried     Past Surgical History  Procedure Laterality Date  . No past surgeries     History   Social History  . Marital Status: Single    Spouse Name: N/A    Number of Children: N/A  . Years of Education: N/A   Occupational History  . Not on file.   Social History Main Topics  . Smoking status: Former Smoker -- 0.00 packs/day    Types: Cigarettes  . Smokeless tobacco: Never Used  . Alcohol Use: Yes     Comment: 1-2 glasses of wine per week  . Drug Use: No  . Sexual Activity: Yes    Birth Control/ Protection: Condom, None   Other Topics Concern  . Not on file   Social History  Narrative   ** Merged History Encounter **       No current facility-administered medications on file prior to encounter.   No current outpatient prescriptions on file prior to encounter.   Allergies  Allergen Reactions  . Lorazepam Anxiety    Hallucination     ROS: Positive for left lower quadrant pain, low back pain, dizziness, dysuria and frequency. Negative for fever, chills, hematuria, flank pain, nausea, vomiting, diarrhea, constipation, vaginal discharge, intermenstrual bleeding, breast tenderness or dyspareunia.  OBJECTIVE Blood pressure 108/73, pulse 80, temperature 98.2 F (36.8 C), temperature source Oral, resp. rate 18, height 5\' 5"  (1.651 m), weight 64.864 kg (143 lb), last menstrual period 06/22/2013, SpO2 100.00%. GENERAL: Well-developed, well-nourished female in no acute distress.  HEENT: Normocephalic HEART: normal rate RESP: normal effort ABDOMEN: Soft, mild left lower cautery and tenderness. Positive bowel sounds. Negative guarding, negative rebound, negative mass. No CVA tenderness. EXTREMITIES: Nontender, no edema NEURO: Alert and oriented SPECULUM EXAM: Deferred due to recent exam. BIMANUAL: cervix closed; uterus normal size, no adnexal tenderness or masses. Mild cervical motion tenderness.  LAB RESULTS Results for orders placed during the  hospital encounter of 07/20/13 (from the past 24 hour(s))  URINALYSIS, ROUTINE W REFLEX MICROSCOPIC     Status: Abnormal   Collection Time    07/20/13 12:32 AM      Result Value Range   Color, Urine YELLOW  YELLOW   APPearance CLEAR  CLEAR   Specific Gravity, Urine 1.025  1.005 - 1.030   pH 6.0  5.0 - 8.0   Glucose, UA NEGATIVE  NEGATIVE mg/dL   Hgb urine dipstick SMALL (*) NEGATIVE   Bilirubin Urine NEGATIVE  NEGATIVE   Ketones, ur NEGATIVE  NEGATIVE mg/dL   Protein, ur NEGATIVE  NEGATIVE mg/dL   Urobilinogen, UA 0.2  0.0 - 1.0 mg/dL   Nitrite NEGATIVE  NEGATIVE   Leukocytes, UA NEGATIVE  NEGATIVE  URINE  MICROSCOPIC-ADD ON     Status: None   Collection Time    07/20/13 12:32 AM      Result Value Range   Squamous Epithelial / LPF RARE  RARE   WBC, UA 0-2  <3 WBC/hpf   RBC / HPF 3-6  <3 RBC/hpf   Bacteria, UA RARE  RARE  URINE RAPID DRUG SCREEN (HOSP PERFORMED)     Status: None   Collection Time    07/20/13 12:32 AM      Result Value Range   Opiates NONE DETECTED  NONE DETECTED   Cocaine NONE DETECTED  NONE DETECTED   Benzodiazepines NONE DETECTED  NONE DETECTED   Amphetamines NONE DETECTED  NONE DETECTED   Tetrahydrocannabinol NONE DETECTED  NONE DETECTED   Barbiturates NONE DETECTED  NONE DETECTED  POCT PREGNANCY, URINE     Status: None   Collection Time    07/20/13 12:43 AM      Result Value Range   Preg Test, Ur NEGATIVE  NEGATIVE  CBC     Status: None   Collection Time    07/20/13  1:20 AM      Result Value Range   WBC 10.3  4.0 - 10.5 K/uL   RBC 4.47  3.87 - 5.11 MIL/uL   Hemoglobin 13.7  12.0 - 15.0 g/dL   HCT 16.1  09.6 - 04.5 %   MCV 87.2  78.0 - 100.0 fL   MCH 30.6  26.0 - 34.0 pg   MCHC 35.1  30.0 - 36.0 g/dL   RDW 40.9  81.1 - 91.4 %   Platelets 292  150 - 400 K/uL  GLUCOSE, CAPILLARY     Status: None   Collection Time    07/20/13  1:35 AM      Result Value Range   Glucose-Capillary 97  70 - 99 mg/dL    IMAGING US Transvaginal Non-ob  07/20/2013   CLINICAL DATA:  Right lower quadrant pain.  EXAM: TRANSABDOMINAL AND TRANSVAGINAL ULTRASOUND OF PELVIS  TECHNIQUE: Both transabdominal and transvaginal ultrasound examinations of the pelvis were performed. Transabdominal technique was performed for global imaging of the pelvis including uterus, ovaries, adnexal regions, and pelvic cul-de-sac. It was necessary to proceed with endovaginal exam following the transabdominal exam to visualize the right ovary.  COMPARISON:  None  FINDINGS: Uterus  Measurements: 8.6 x 4.0 x 5.1 cm No fibroids or other mass visualized.  Endometrium  Thickness: 1 cm.  No focal abnormality  visualized.  Right ovary  Measurements: 4.6 x 2.8 x 2.8 cm. Normal appearance/no adnexal mass.  Left ovary  Measurements: 4.0 x 2.1 x 2.1 cm. Normal appearance/no adnexal mass.  Other findings  No free fluid.  IMPRESSION:  Normal examination.   Electronically Signed   By: Drusilla Kanner M.D.   On: 07/20/2013 02:57   US Pelvis Complete  07/20/2013   CLINICAL DATA:  Right lower quadrant pain.  EXAM: TRANSABDOMINAL AND TRANSVAGINAL ULTRASOUND OF PELVIS  TECHNIQUE: Both transabdominal and transvaginal ultrasound examinations of the pelvis were performed. Transabdominal technique was performed for global imaging of the pelvis including uterus, ovaries, adnexal regions, and pelvic cul-de-sac. It was necessary to proceed with endovaginal exam following the transabdominal exam to visualize the right ovary.  COMPARISON:  None  FINDINGS: Uterus  Measurements: 8.6 x 4.0 x 5.1 cm No fibroids or other mass visualized.  Endometrium  Thickness: 1 cm.  No focal abnormality visualized.  Right ovary  Measurements: 4.6 x 2.8 x 2.8 cm. Normal appearance/no adnexal mass.  Left ovary  Measurements: 4.0 x 2.1 x 2.1 cm. Normal appearance/no adnexal mass.  Other findings  No free fluid.  IMPRESSION: Normal examination.   Electronically Signed   By: Drusilla Kanner M.D.   On: 07/20/2013 02:57     MAU COURSE 0315: Upon returning from ultrasound patient reported right-sided headache and wanted to keep the light off. Abdominal pain resolved with Toradol. Dizziness resolved with oral fluids. Denies weakness. No difficulty with speech or gait. Equal strength and sensation bilateral upper and lower extremities. Suggested headache workup at [place] and offered pain medication. Patient declined multiple times stating she was ready to call her taxi.  Patient ambulating in maternity admissions in no apparent distress a few minutes later.  ASSESSMENT 1. UTI (lower urinary tract infection)   2. Headache   3. Pregnancy test negative      PLAN Discharge home in stable condition. Headache red flags reviewed. [facility] ED for severe headache or if accompanied by fever, neck pain, weakness or difficulties with speech or gait. Take home pregnancy test if you are late for your period.      Follow-up Information   Follow up with [facility] ED. (As needed if symptoms worsen)        Medication List    STOP taking these medications       doxycycline 100 MG capsule  Commonly known as:  VIBRAMYCIN      TAKE these medications       butalbital-acetaminophen-caffeine 50-325-40 MG per tablet  Commonly known as:  FIORICET  Take 1 tablet by mouth every 6 (six) hours as needed for headache.     cephALEXin 500 MG capsule  Commonly known as:  KEFLEX  Take 1 capsule (500 mg total) by mouth 4 (four) times daily.     promethazine 25 MG tablet  Commonly known as:  PHENERGAN  Take 0.5-1 tablets (12.5-25 mg total) by mouth every 6 (six) hours as needed for nausea.        Haskins, CNM 07/20/2013  4:02 AM

## 2013-07-23 NOTE — MAU Provider Note (Signed)
Attestation of Attending Supervision of Advanced Practitioner: Evaluation and management procedures were performed by the PA/NP/CNM/OB Fellow under my supervision/collaboration. Chart reviewed and agree with management and plan.  Trenise Turay V 07/23/2013 10:50 PM    

## 2013-09-07 IMAGING — CR DG CHEST 2V
2 series · 2 of 2 positions shown · non-contrast
Comparison: 09/02/2010

CLINICAL DATA: Chest pain

CHEST - 2 VIEW

[w chest pa]
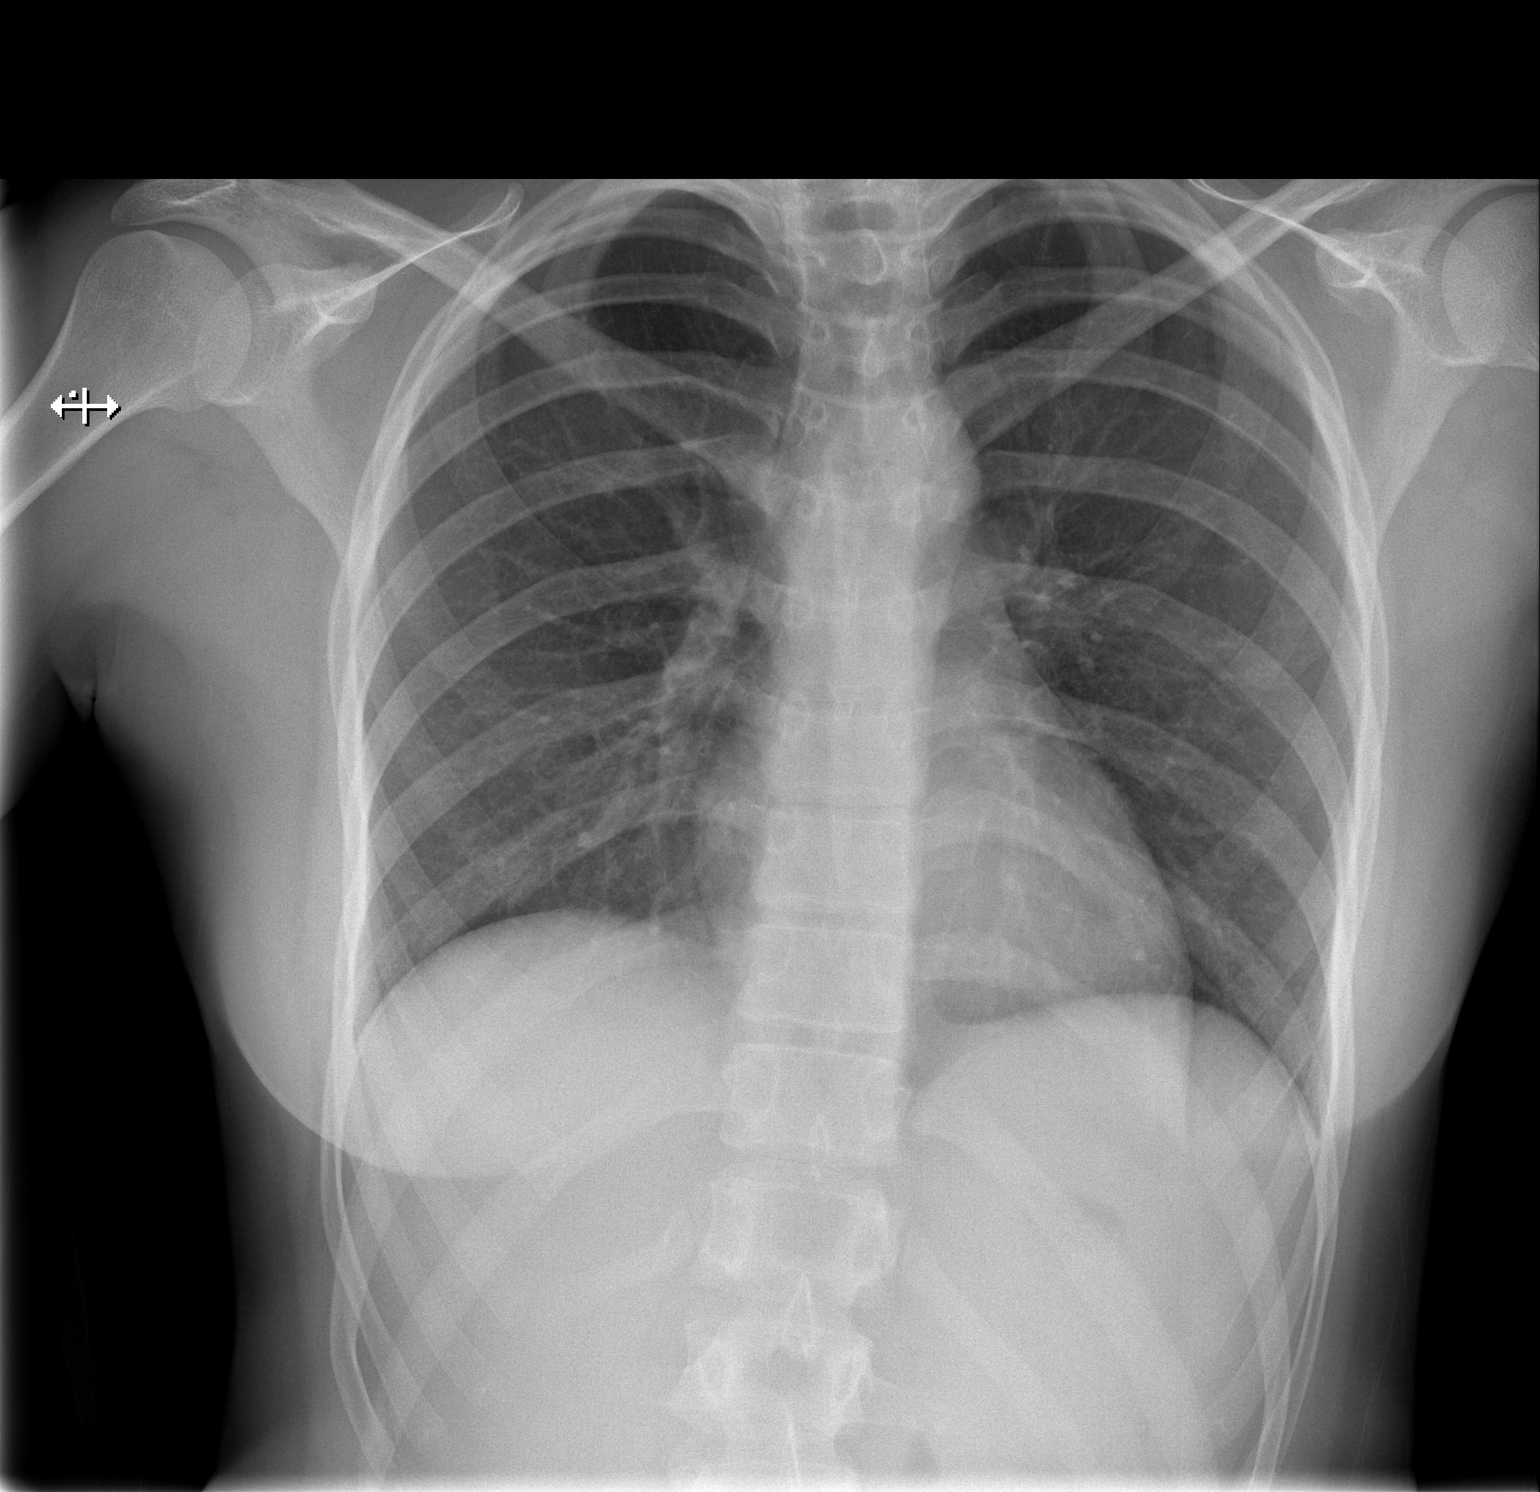

[w chest lat]
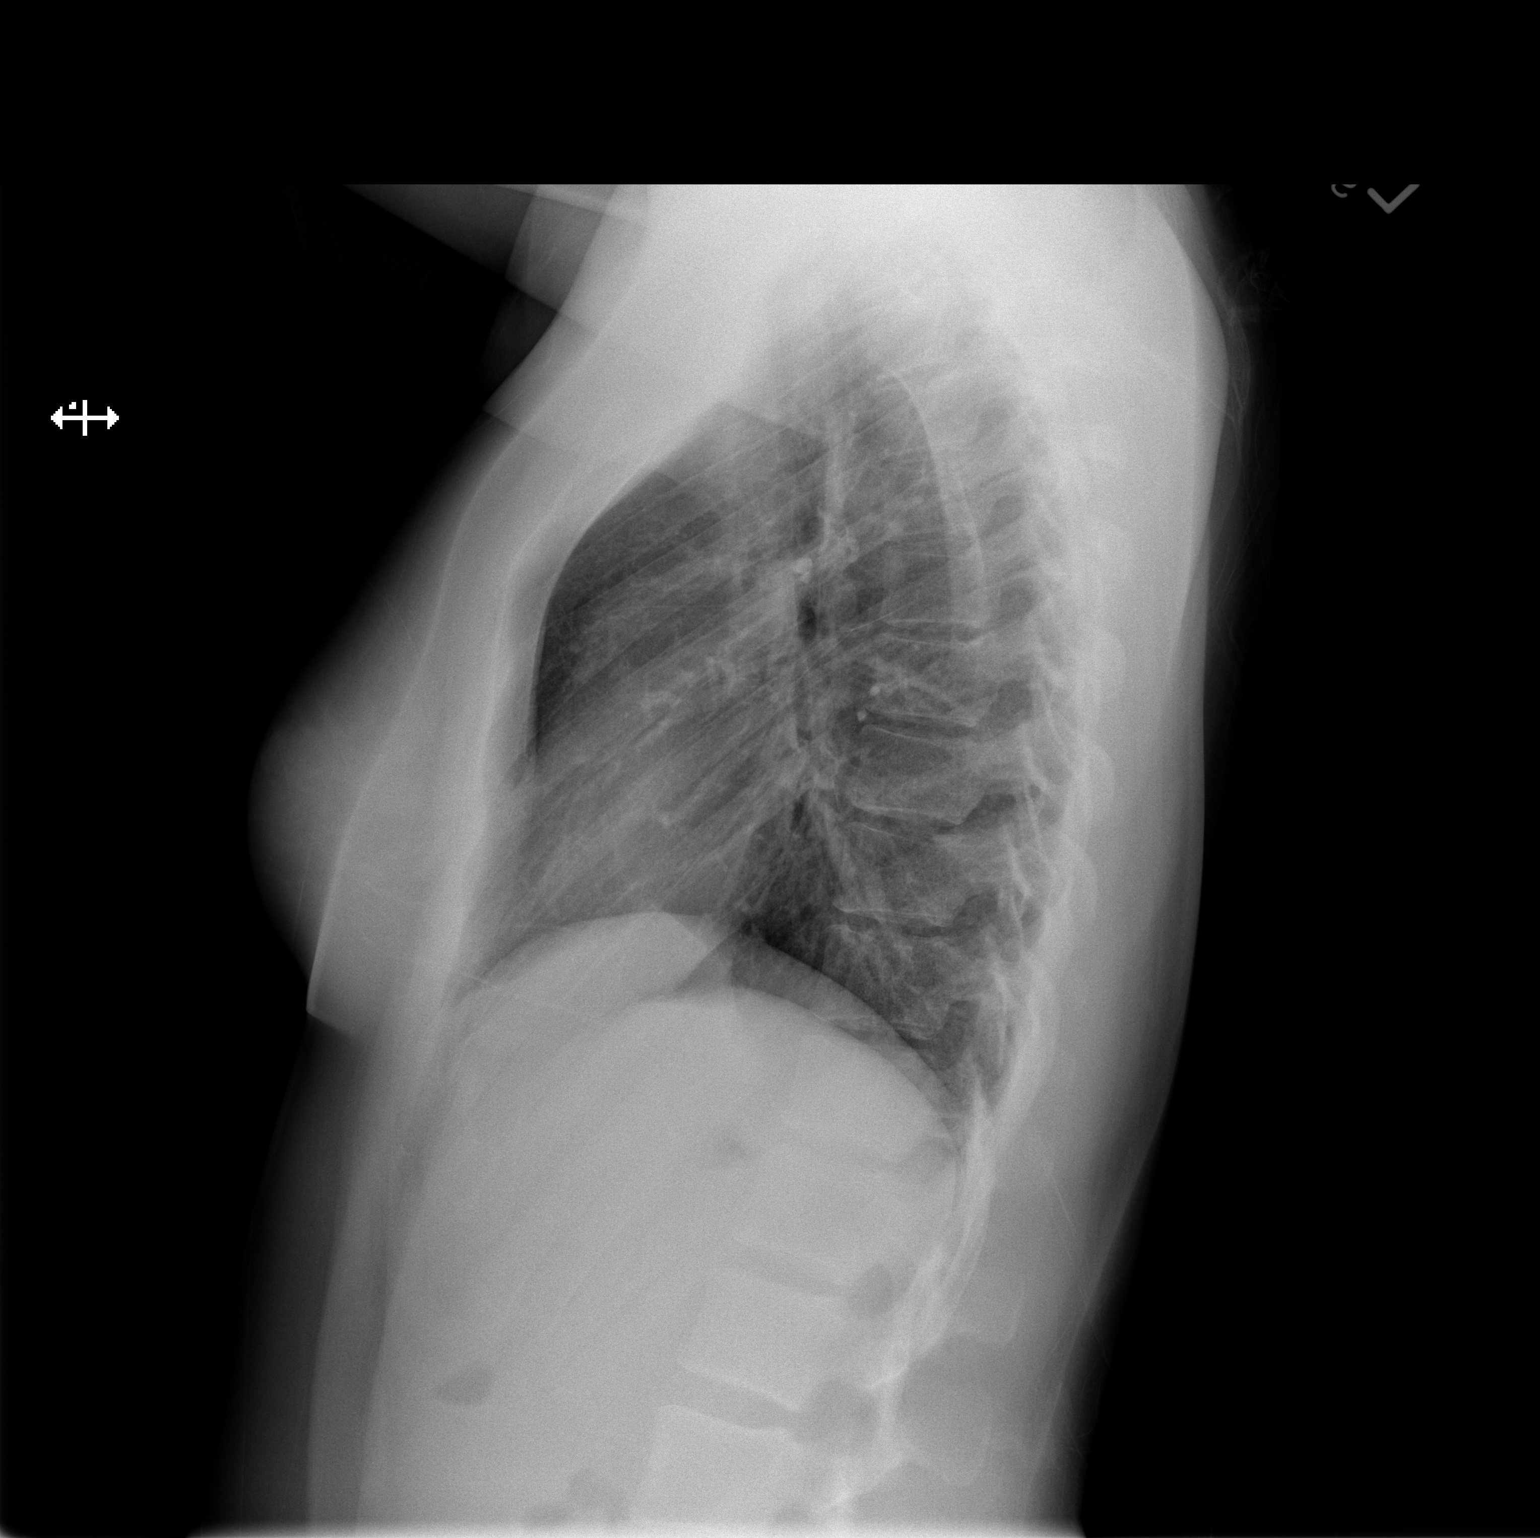

[2 of 2 positions shown; findings below may reference images not displayed]

FINDINGS: The heart size and mediastinal contours are within
normal limits.  Both lungs are clear.  The visualized skeletal
structures are unremarkable.
IMPRESSION: No active cardiopulmonary disease.

## 2013-09-27 IMAGING — US US PELVIS COMPLETE
1 series · 14 of 25 positions shown · non-contrast
Comparison: None

CLINICAL DATA: Right lower quadrant pain.

EXAM:
TRANSABDOMINAL AND TRANSVAGINAL ULTRASOUND OF PELVIS
TECHNIQUE: Both transabdominal and transvaginal ultrasound examinations of the
pelvis were performed. Transabdominal technique was performed for
global imaging of the pelvis including uterus, ovaries, adnexal
regions, and pelvic cul-de-sac. It was necessary to proceed with
endovaginal exam following the transabdominal exam to visualize the
right ovary.

[Series 1: us pelvis complete · 14 of 35 slices shown]
[im 1/35]
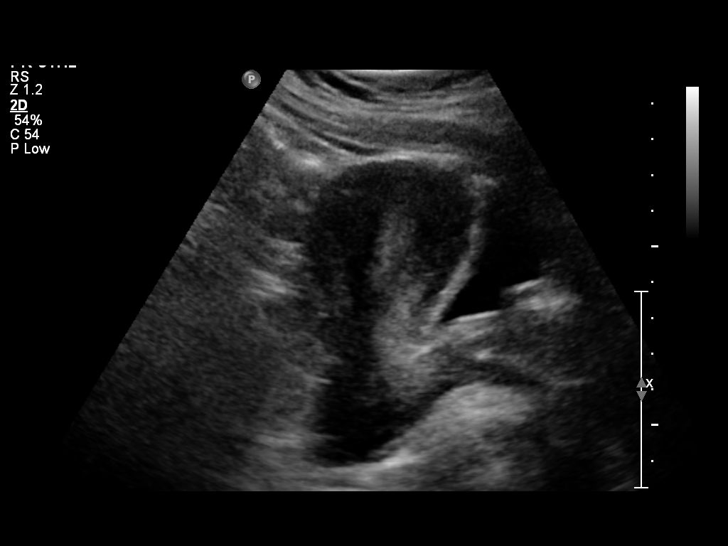
[im 3/35]
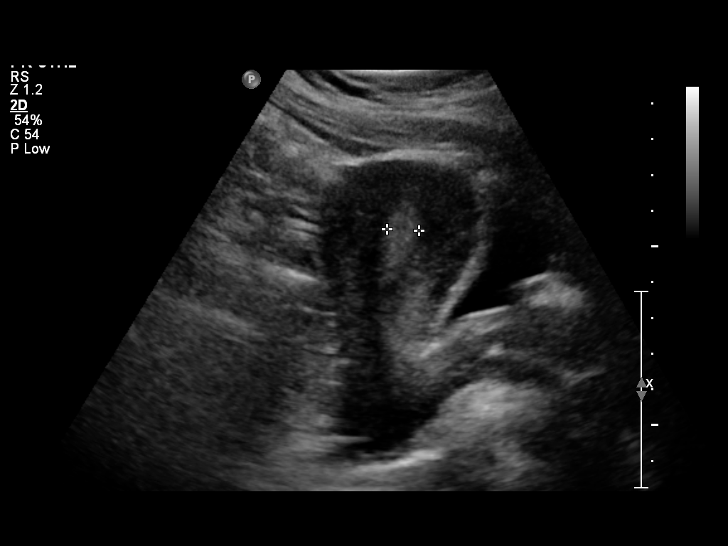
[im 6/35]
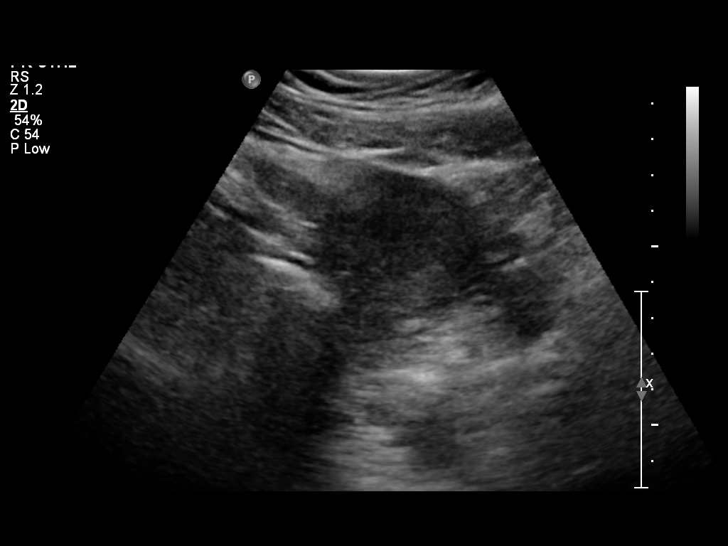
[im 9/35]
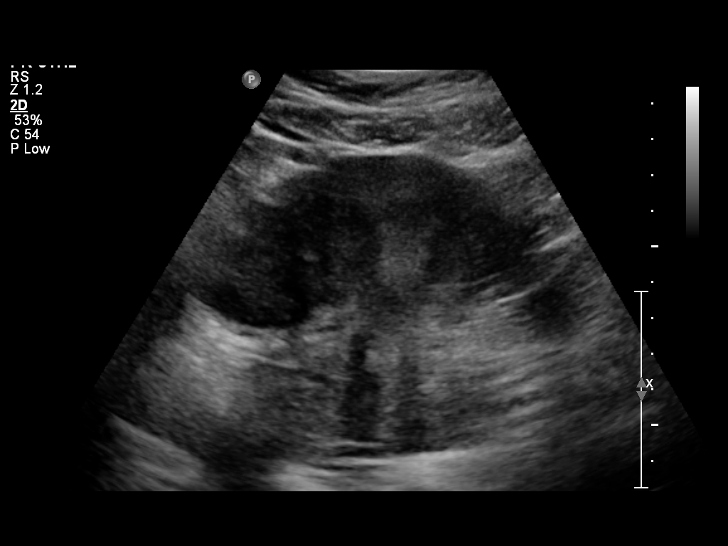
[im 12/35]
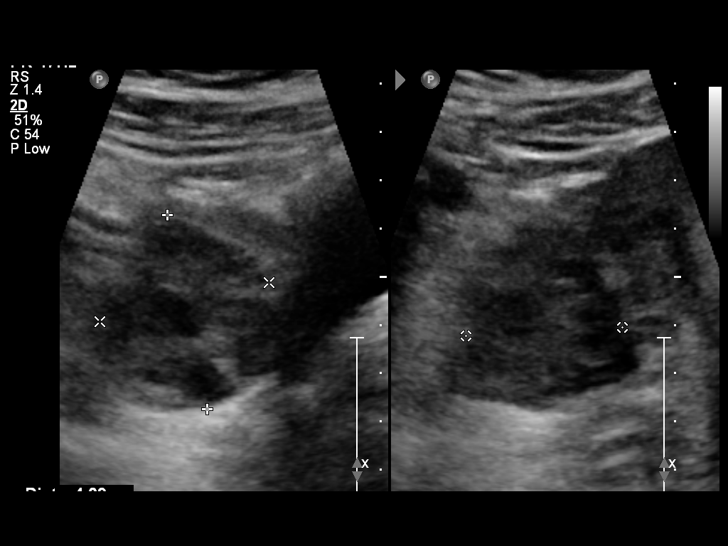
[im 13/35]
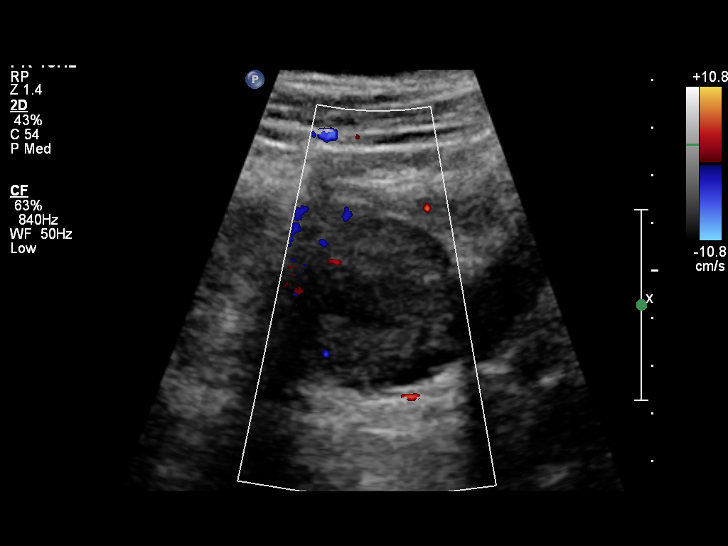
[im 16/35]
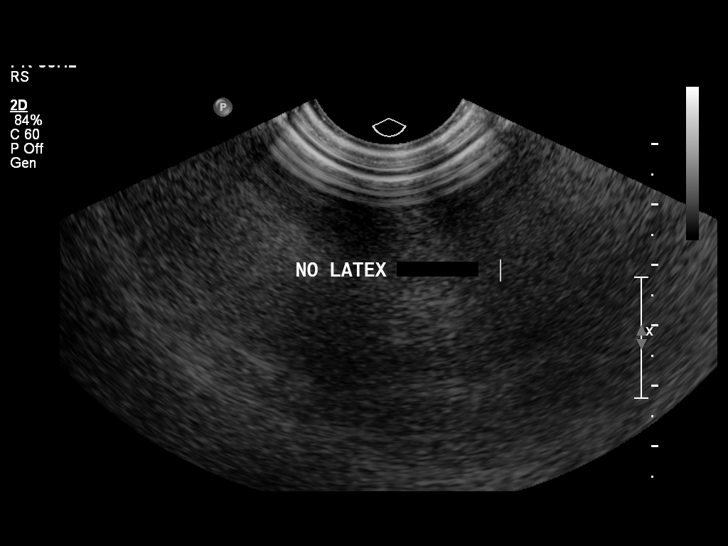
[im 19/35]
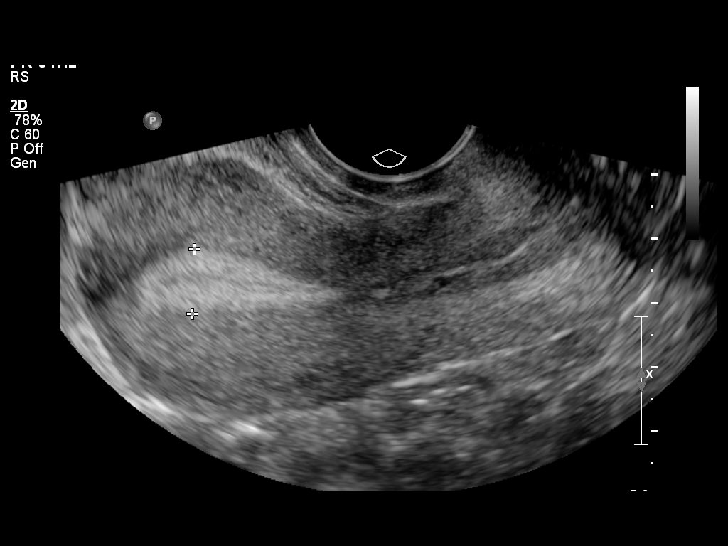
[im 22/35]
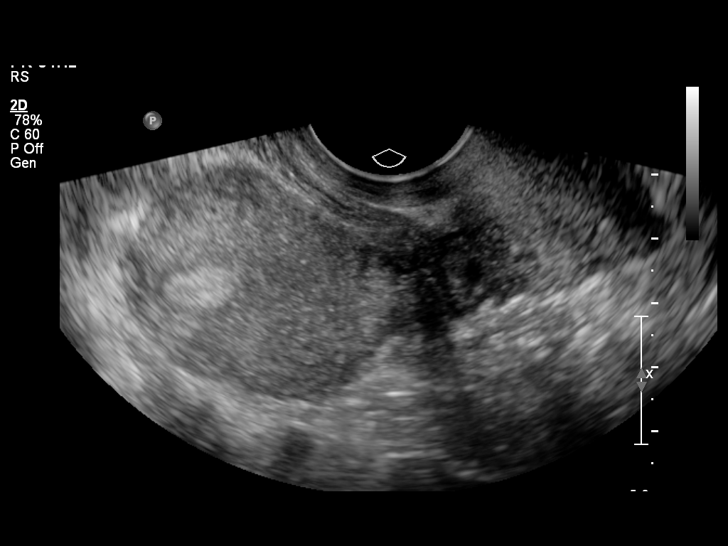
[im 23/35]
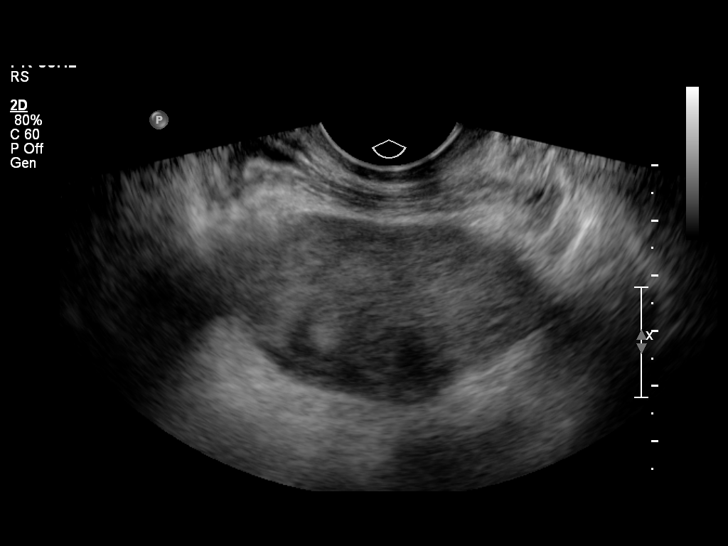
[im 26/35]
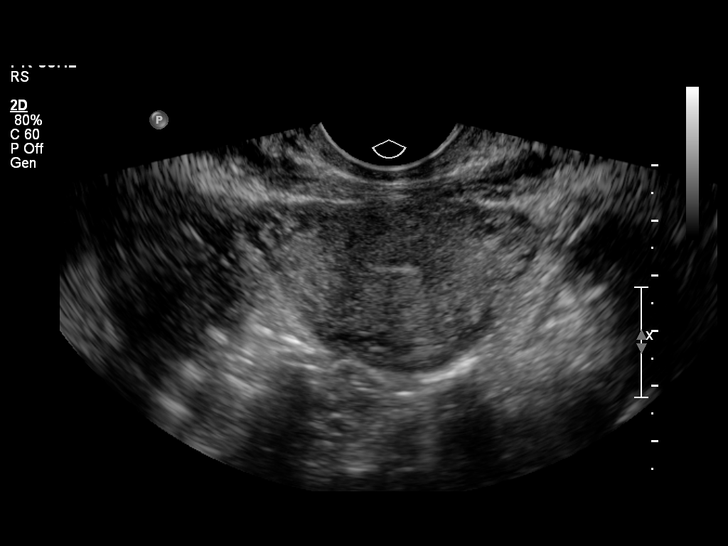
[im 29/35]
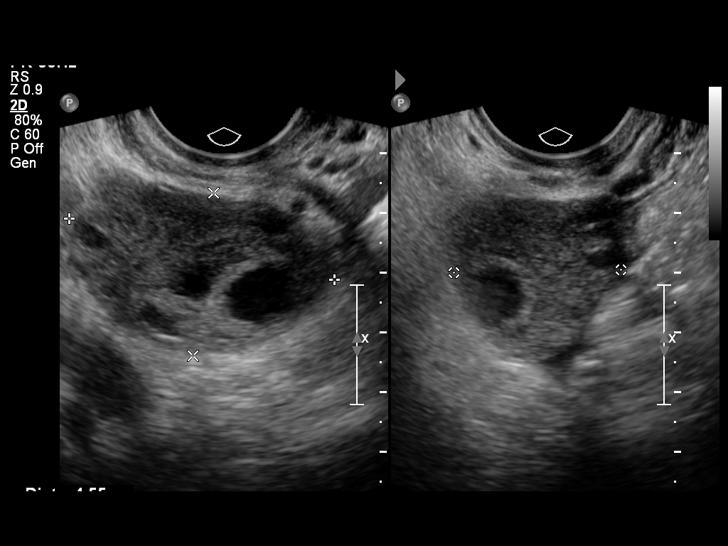
[im 32/35]
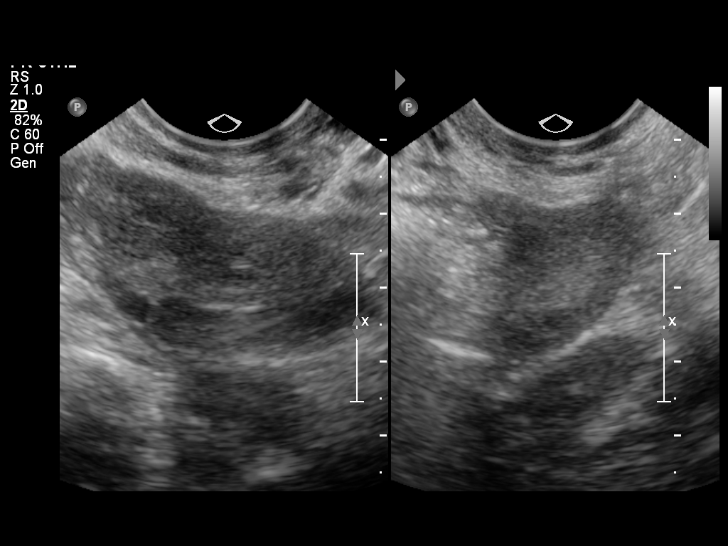
[im 35/35]
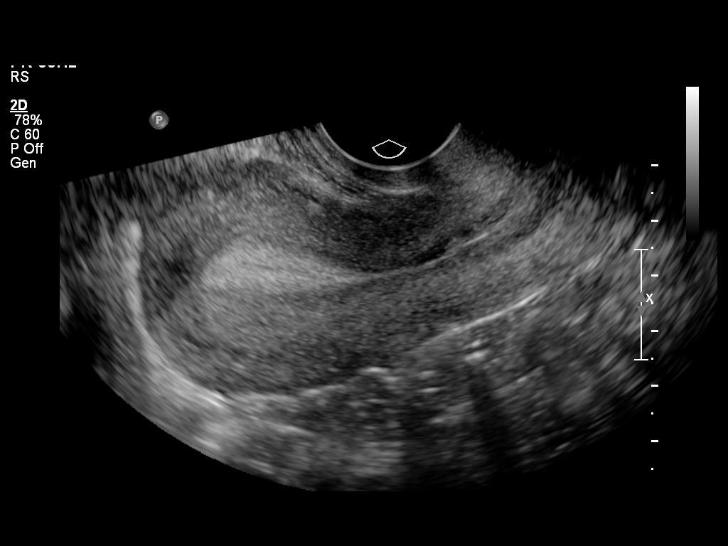

[14 of 25 positions shown; findings below may reference images not displayed]

FINDINGS: Uterus

Measurements: 8.6 x 4.0 x 5.1 cm No fibroids or other mass
visualized.

Endometrium

Thickness: 1 cm.  No focal abnormality visualized.

Right ovary

Measurements: 4.6 x 2.8 x 2.8 cm. Normal appearance/no adnexal mass.

Left ovary

Measurements: 4.0 x 2.1 x 2.1 cm. Normal appearance/no adnexal mass.

Other findings

No free fluid.
IMPRESSION: Normal examination.

## 2014-02-08 ENCOUNTER — Emergency Department (HOSPITAL_COMMUNITY): Payer: Self-pay

## 2014-02-08 ENCOUNTER — Encounter (HOSPITAL_COMMUNITY): Payer: Self-pay | Admitting: Emergency Medicine

## 2014-02-08 ENCOUNTER — Emergency Department (HOSPITAL_COMMUNITY)
Admission: EM | Admit: 2014-02-08 | Discharge: 2014-02-08 | Disposition: A | Discharge status: Home / Self Care | Payer: Self-pay | Attending: Emergency Medicine | Admitting: Emergency Medicine

## 2014-02-08 DIAGNOSIS — G8929 Other chronic pain: Secondary | ICD-10-CM | POA: Insufficient documentation

## 2014-02-08 DIAGNOSIS — R0602 Shortness of breath: Secondary | ICD-10-CM | POA: Insufficient documentation

## 2014-02-08 DIAGNOSIS — Z79899 Other long term (current) drug therapy: Secondary | ICD-10-CM | POA: Insufficient documentation

## 2014-02-08 DIAGNOSIS — R519 Headache, unspecified: Secondary | ICD-10-CM

## 2014-02-08 DIAGNOSIS — Z3202 Encounter for pregnancy test, result negative: Secondary | ICD-10-CM | POA: Insufficient documentation

## 2014-02-08 DIAGNOSIS — Z8619 Personal history of other infectious and parasitic diseases: Secondary | ICD-10-CM | POA: Insufficient documentation

## 2014-02-08 DIAGNOSIS — Z87891 Personal history of nicotine dependence: Secondary | ICD-10-CM | POA: Insufficient documentation

## 2014-02-08 DIAGNOSIS — R0789 Other chest pain: Secondary | ICD-10-CM | POA: Insufficient documentation

## 2014-02-08 DIAGNOSIS — R51 Headache: Secondary | ICD-10-CM | POA: Insufficient documentation

## 2014-02-08 LAB — URINALYSIS, ROUTINE W REFLEX MICROSCOPIC
Bilirubin Urine: NEGATIVE
GLUCOSE, UA: NEGATIVE mg/dL
HGB URINE DIPSTICK: NEGATIVE
KETONES UR: NEGATIVE mg/dL
Leukocytes, UA: NEGATIVE
Nitrite: NEGATIVE
PROTEIN: NEGATIVE mg/dL
Specific Gravity, Urine: 1.01 (ref 1.005–1.030)
Urobilinogen, UA: 0.2 mg/dL (ref 0.0–1.0)
pH: 7 (ref 5.0–8.0)

## 2014-02-08 LAB — BASIC METABOLIC PANEL
BUN: 10 mg/dL (ref 6–23)
CALCIUM: 9.9 mg/dL (ref 8.4–10.5)
CO2: 27 mEq/L (ref 19–32)
Chloride: 101 mEq/L (ref 96–112)
Creatinine, Ser: 0.93 mg/dL (ref 0.50–1.10)
GFR calc Af Amer: >90 mL/min (ref >90)
GFR, EST NON AFRICAN AMERICAN: 87 mL/min — AB (ref >90)
Glucose, Bld: 106 mg/dL — ABNORMAL HIGH (ref 70–99)
Potassium: 4 mEq/L (ref 3.7–5.3)
SODIUM: 139 meq/L (ref 137–147)

## 2014-02-08 LAB — D-DIMER, QUANTITATIVE: D-Dimer, Quant: <0.27 ug/mL-FEU (ref 0.00–0.48)

## 2014-02-08 LAB — I-STAT TROPONIN, ED: Troponin i, poc: 0 ng/mL (ref 0.00–0.08)

## 2014-02-08 LAB — CBC
HCT: 40.3 % (ref 36.0–46.0)
Hemoglobin: 13.9 g/dL (ref 12.0–15.0)
MCH: 31.7 pg (ref 26.0–34.0)
MCHC: 34.5 g/dL (ref 30.0–36.0)
MCV: 92 fL (ref 78.0–100.0)
Platelets: 281 10*3/uL (ref 150–400)
RBC: 4.38 MIL/uL (ref 3.87–5.11)
RDW: 11.9 % (ref 11.5–15.5)
WBC: 8.4 10*3/uL (ref 4.0–10.5)

## 2014-02-08 LAB — POC URINE PREG, ED: Preg Test, Ur: NEGATIVE

## 2014-02-08 MED ORDER — SODIUM CHLORIDE 0.9 % IV SOLN
Freq: Once | INTRAVENOUS | Status: AC
  Administered 2014-02-08: 04:00:00 via INTRAVENOUS

## 2014-02-08 MED ORDER — METOCLOPRAMIDE HCL 5 MG/ML IJ SOLN
10.0000 mg | Freq: Once | INTRAMUSCULAR | Status: AC
  Administered 2014-02-08: 10 mg via INTRAVENOUS
  Filled 2014-02-08: qty 2

## 2014-02-08 MED ORDER — KETOROLAC TROMETHAMINE 30 MG/ML IJ SOLN
30.0000 mg | Freq: Once | INTRAMUSCULAR | Status: AC
  Administered 2014-02-08: 30 mg via INTRAVENOUS
  Filled 2014-02-08: qty 1

## 2014-02-08 MED ORDER — DIPHENHYDRAMINE HCL 50 MG/ML IJ SOLN
25.0000 mg | Freq: Once | INTRAMUSCULAR | Status: AC
  Administered 2014-02-08: 25 mg via INTRAVENOUS
  Filled 2014-02-08: qty 1

## 2014-02-08 NOTE — ED Notes (Signed)
EKG given to EDP,Otter,MD., for review. 

## 2014-02-08 NOTE — Discharge Instructions (Signed)
Chest Pain (Nonspecific) Chest pain has many causes. Your pain could be caused by something serious, such as a heart attack or a blood clot in the lungs. It could also be caused by something less serious, such as a chest bruise or a virus. Follow up with your doctor. More lab tests or other studies may be needed to find the cause of your pain. Most of the time, nonspecific chest pain will improve within 2 to 3 days of rest and mild pain medicine. HOME CARE  For chest bruises, you may put ice on the sore area for 15-20 minutes, 03-04 times a day. Do this only if it makes you feel better.  Put ice in a plastic bag.  Place a towel between the skin and the bag.  Rest for the next 2 to 3 days.  Go back to work if the pain improves.  See your doctor if the pain lasts longer than 1 to 2 weeks.  Only take medicine as told by your doctor.  Quit smoking if you smoke. GET HELP RIGHT AWAY IF:   There is more pain or pain that spreads to the arm, neck, jaw, back, or belly (abdomen).  You have shortness of breath.  You cough more than usual or cough up blood.  You have very bad back or belly pain, feel sick to your stomach (nauseous), or throw up (vomit).  You have very bad weakness.  You pass out (faint).  You have a fever. Any of these problems may be serious and may be an emergency. Do not wait to see if the problems will go away. Get medical help right away. Call your local emergency services 911 in [place] Do not drive yourself to the hospital. MAKE SURE YOU:   Understand these instructions.  Will watch this condition.  Will get help right away if you or your child is not doing well or gets worse. Document Released: 03/30/2008 Document Revised: 01/04/2012 Document Reviewed: 03/30/2008 [facility] Patient Information 2014 [place], [place].  Headaches, Frequently Asked Questions MIGRAINE HEADACHES Q: What is migraine? What causes it? How can I treat it? A: Generally, migraine headaches  begin as a dull ache. Then they develop into a constant, throbbing, and pulsating pain. You may experience pain at the temples. You may experience pain at the front or back of one or both sides of the head. The pain is usually accompanied by a combination of:  Nausea.  Vomiting.  Sensitivity to light and noise. Some people (about 15%) experience an aura (see below) before an attack. The cause of migraine is believed to be chemical reactions in the brain. Treatment for migraine may include over-the-counter or prescription medications. It may also include self-help techniques. These include relaxation training and biofeedback.  Q: What is an aura? A: About 15% of people with migraine get an "aura". This is a sign of neurological symptoms that occur before a migraine headache. You may see wavy or jagged lines, dots, or flashing lights. You might experience tunnel vision or blind spots in one or both eyes. The aura can include visual or auditory hallucinations (something imagined). It may include disruptions in smell (such as strange odors), taste or touch. Other symptoms include:  Numbness.  A "pins and needles" sensation.  Difficulty in recalling or speaking the correct word. These neurological events may last as long as 60 minutes. These symptoms will fade as the headache begins. Q: What is a trigger? A: Certain physical or environmental factors can lead to  or "trigger" a migraine. These include:  Foods.  Hormonal changes.  Weather.  Stress. It is important to remember that triggers are different for everyone. To help prevent migraine attacks, you need to figure out which triggers affect you. Keep a headache diary. This is a good way to track triggers. The diary will help you talk to your healthcare professional about your condition. Q: Does weather affect migraines? A: Bright sunshine, hot, humid conditions, and drastic changes in barometric pressure may lead to, or "trigger," a migraine  attack in some people. But studies have shown that weather does not act as a trigger for everyone with migraines. Q: What is the link between migraine and hormones? A: Hormones start and regulate many of your body's functions. Hormones keep your body in balance within a constantly changing environment. The levels of hormones in your body are unbalanced at times. Examples are during menstruation, pregnancy, or menopause. That can lead to a migraine attack. In fact, about three quarters of all women with migraine report that their attacks are related to the menstrual cycle.  Q: Is there an increased risk of stroke for migraine sufferers? A: The likelihood of a migraine attack causing a stroke is very remote. That is not to say that migraine sufferers cannot have a stroke associated with their migraines. In persons under age 66, the most common associated factor for stroke is migraine headache. But over the course of a person's normal life span, the occurrence of migraine headache may actually be associated with a reduced risk of dying from cerebrovascular disease due to stroke.  Q: What are acute medications for migraine? A: Acute medications are used to treat the pain of the headache after it has started. Examples over-the-counter medications, NSAIDs, ergots, and triptans.  Q: What are the triptans? A: Triptans are the newest class of abortive medications. They are specifically targeted to treat migraine. Triptans are vasoconstrictors. They moderate some chemical reactions in the brain. The triptans work on receptors in your brain. Triptans help to restore the balance of a neurotransmitter called serotonin. Fluctuations in levels of serotonin are thought to be a main cause of migraine.  Q: Are over-the-counter medications for migraine effective? A: Over-the-counter, or "OTC," medications may be effective in relieving mild to moderate pain and associated symptoms of migraine. But you should see your  caregiver before beginning any treatment regimen for migraine.  Q: What are preventive medications for migraine? A: Preventive medications for migraine are sometimes referred to as "prophylactic" treatments. They are used to reduce the frequency, severity, and length of migraine attacks. Examples of preventive medications include antiepileptic medications, antidepressants, beta-blockers, calcium channel blockers, and NSAIDs (nonsteroidal anti-inflammatory drugs). Q: Why are anticonvulsants used to treat migraine? A: During the past few years, there has been an increased interest in antiepileptic drugs for the prevention of migraine. They are sometimes referred to as "anticonvulsants". Both epilepsy and migraine may be caused by similar reactions in the brain.  Q: Why are antidepressants used to treat migraine? A: Antidepressants are typically used to treat people with depression. They may reduce migraine frequency by regulating chemical levels, such as serotonin, in the brain.  Q: What alternative therapies are used to treat migraine? A: The term "alternative therapies" is often used to describe treatments considered outside the scope of conventional Western medicine. Examples of alternative therapy include acupuncture, acupressure, and yoga. Another common alternative treatment is herbal therapy. Some herbs are believed to relieve headache pain. Always discuss alternative therapies with  your caregiver before proceeding. Some herbal products contain arsenic and other toxins. TENSION HEADACHES Q: What is a tension-type headache? What causes it? How can I treat it? A: Tension-type headaches occur randomly. They are often the result of temporary stress, anxiety, fatigue, or anger. Symptoms include soreness in your temples, a tightening band-like sensation around your head (a "vice-like" ache). Symptoms can also include a pulling feeling, pressure sensations, and contracting head and neck muscles. The  headache begins in your forehead, temples, or the back of your head and neck. Treatment for tension-type headache may include over-the-counter or prescription medications. Treatment may also include self-help techniques such as relaxation training and biofeedback. CLUSTER HEADACHES Q: What is a cluster headache? What causes it? How can I treat it? A: Cluster headache gets its name because the attacks come in groups. The pain arrives with little, if any, warning. It is usually on one side of the head. A tearing or bloodshot eye and a runny nose on the same side of the headache may also accompany the pain. Cluster headaches are believed to be caused by chemical reactions in the brain. They have been described as the most severe and intense of any headache type. Treatment for cluster headache includes prescription medication and oxygen. SINUS HEADACHES Q: What is a sinus headache? What causes it? How can I treat it? A: When a cavity in the bones of the face and skull (a sinus) becomes inflamed, the inflammation will cause localized pain. This condition is usually the result of an allergic reaction, a tumor, or an infection. If your headache is caused by a sinus blockage, such as an infection, you will probably have a fever. An x-ray will confirm a sinus blockage. Your caregiver's treatment might include antibiotics for the infection, as well as antihistamines or decongestants.  REBOUND HEADACHES Q: What is a rebound headache? What causes it? How can I treat it? A: A pattern of taking acute headache medications too often can lead to a condition known as "rebound headache." A pattern of taking too much headache medication includes taking it more than 2 days per week or in excessive amounts. That means more than the label or a caregiver advises. With rebound headaches, your medications not only stop relieving pain, they actually begin to cause headaches. Doctors treat rebound headache by tapering the medication  that is being overused. Sometimes your caregiver will gradually substitute a different type of treatment or medication. Stopping may be a challenge. Regularly overusing a medication increases the potential for serious side effects. Consult a caregiver if you regularly use headache medications more than 2 days per week or more than the label advises. ADDITIONAL QUESTIONS AND ANSWERS Q: What is biofeedback? A: Biofeedback is a self-help treatment. Biofeedback uses special equipment to monitor your body's involuntary physical responses. Biofeedback monitors:  Breathing.  Pulse.  Heart rate.  Temperature.  Muscle tension.  Brain activity. Biofeedback helps you refine and perfect your relaxation exercises. You learn to control the physical responses that are related to stress. Once the technique has been mastered, you do not need the equipment any more. Q: Are headaches hereditary? A: Four out of five (80%) of people that suffer report a family history of migraine. Scientists are not sure if this is genetic or a family predisposition. Despite the uncertainty, a child has a 50% chance of having migraine if one parent suffers. The child has a 75% chance if both parents suffer.  Q: Can children get headaches? A: By the  time they reach high school, most young people have experienced some type of headache. Many safe and effective approaches or medications can prevent a headache from occurring or stop it after it has begun.  Q: What type of doctor should I see to diagnose and treat my headache? A: Start with your primary caregiver. Discuss his or her experience and approach to headaches. Discuss methods of classification, diagnosis, and treatment. Your caregiver may decide to recommend you to a headache specialist, depending upon your symptoms or other physical conditions. Having diabetes, allergies, etc., may require a more comprehensive and inclusive approach to your headache. The National Headache  Foundation will provide, upon request, a list of [facility] physician members in your state. Document Released: 01/02/2004 Document Revised: 01/04/2012 Document Reviewed: 06/11/2008 [facility] Patient Information 2014 [place].  Pain of Unknown Etiology (Pain Without a Known Cause) You have come to your caregiver because of pain. Pain can occur in any part of the body. Often there is not a definite cause. If your laboratory (blood or urine) work was normal and X-rays or other studies were normal, your caregiver may treat you without knowing the cause of the pain. An example of this is the headache. Most headaches are diagnosed by taking a history. This means your caregiver asks you questions about your headaches. Your caregiver determines a treatment based on your answers. Usually testing done for headaches is normal. Often testing is not done unless there is no response to medications. Regardless of where your pain is located today, you can be given medications to make you comfortable. If no physical cause of pain can be found, most cases of pain will gradually leave as suddenly as they came.  If you have a painful condition and no reason can be found for the pain, it is important that you follow up with your caregiver. If the pain becomes worse or does not go away, it may be necessary to repeat tests and look further for a possible cause.  Only take over-the-counter or prescription medicines for pain, discomfort, or fever as directed by your caregiver.  For the protection of your privacy, test results cannot be given over the phone. Make sure you receive the results of your test. Ask how these results are to be obtained if you have not been informed. It is your responsibility to obtain your test results.  You may continue all activities unless the activities cause more pain. When the pain lessens, it is important to gradually resume normal activities. Resume activities by beginning slowly and gradually  increasing the intensity and duration of the activities or exercise. During periods of severe pain, bed rest may be helpful. Lie or sit in any position that is comfortable.  Ice used for acute (sudden) conditions may be effective. Use a large plastic bag filled with ice and wrapped in a towel. This may provide pain relief.  See your caregiver for continued problems. Your caregiver can help or refer you for exercises or physical therapy if necessary. If you were given medications for your condition, do not drive, operate machinery or power tools, or sign legal documents for 24 hours. Do not drink alcohol, take sleeping pills, or take other medications that may interfere with treatment. See your caregiver immediately if you have pain that is becoming worse and not relieved by medications. Document Released: 07/07/2001 Document Revised: 08/02/2013 Document Reviewed: 10/12/2005 [facility] Patient Information 2014 [place].

## 2014-02-08 NOTE — ED Notes (Signed)
Pt presents to ED with c/o chest pain with shortness of breath.  Pt reports that she was playing with her phone when she has sudden onset of left-sided chest pain with shortness of breath.  Pt arrived to ED room ambulatory, respirations smooth and unlabored, skin warm and dry and in no s/s apparent distress but still c/o chest pain at 8/10.

## 2014-02-08 NOTE — ED Provider Notes (Signed)
CSN: [id number]     Arrival date & time 02/08/14  0300 History   First MD Initiated Contact with Patient 02/08/14 [phone]     Chief Complaint  Patient presents with  . Chest Pain     (Consider location/radiation/quality/duration/timing/severity/associated sxs/prior Treatment) HPI 23 year old female presents to emergency room with complaint of left-sided chest pain.  Pain started just prior to arrival.  She reports shortness of breath with the chest pain.  Pain is described as a pressure.  There some radiation into her back.  Patient also complaining of headache.  She reports she has had daily or every other day headaches, ongoing for the last 6 months.  Headaches are associated with nausea.  Headaches are frontal.  Occasionally she has blurred vision with her headaches.  She has not sought care for her headaches, but was prescribed Fioricet and Phenergan, which she reports did not help.  No weakness, numbness, change in behavior, dizziness.  No history of head trauma.  No history of hypertension, hyperlipidemia, or early cardiac death in family.  Patient reports she recently had a train ride from [place] to [place] 5 days ago that lasted 10 hours.  Patient reports that she walked often during the train ride.  She denies any calf pain.  No prior history of PE or DVT.  She does not smoke.  No exogenous hormones. Past Medical History  Diagnosis Date  . No pertinent past medical history   . HPV in female   . Chlamydia   . Gonorrhea   . Abnormal Pap smear    Past Surgical History  Procedure Laterality Date  . No past surgeries     Family History  Problem Relation Age of Onset  . Alcohol abuse Mother   . Depression Mother   . Drug abuse Mother   . Heart disease Mother     mother had a closed valve and had heart surgery  . Alcohol abuse Father   . Drug abuse Father    History  Substance Use Topics  . Smoking status: Former Smoker -- 0.00 packs/day    Types: Cigarettes  .  Smokeless tobacco: Never Used  . Alcohol Use: Yes     Comment: 1-2 glasses of wine per week   OB History   Grav Para Term Preterm Abortions TAB SAB Ect Mult Living   3 1 1  2  2   1      Review of Systems  See History of Present Illness; otherwise all other systems are reviewed and negative   Allergies  Lorazepam  Home Medications   Prior to Admission medications   Medication Sig Start Date End Date Taking? Authorizing Provider  butalbital-acetaminophen-caffeine (FIORICET) 50-325-40 MG per tablet Take 1 tablet by mouth every 6 (six) hours as needed for headache. 07/20/13   Manya Silvas, CNM  promethazine (PHENERGAN) 25 MG tablet Take 0.5-1 tablets (12.5-25 mg total) by mouth every 6 (six) hours as needed for nausea. 07/20/13   Manya Silvas, CNM   BP 127/86  Pulse 73  Temp(Src) 97.7 F (36.5 C) (Oral)  Resp 18  SpO2 93% Physical Exam  Nursing note and vitals reviewed. Constitutional: She is oriented to person, place, and time. She appears well-developed and well-nourished. No distress.  HENT:  Head: Normocephalic and atraumatic.  Right Ear: External ear normal.  Left Ear: External ear normal.  Nose: Nose normal.  Mouth/Throat: Oropharynx is clear and moist.  Eyes: Conjunctivae and EOM are normal. Pupils are equal, round,  and reactive to light.  Neck: Normal range of motion. Neck supple. No JVD present. No tracheal deviation present. No thyromegaly present.  Cardiovascular: Normal rate, regular rhythm, normal heart sounds and intact distal pulses.  Exam reveals no gallop and no friction rub.   No murmur heard. Pulmonary/Chest: Effort normal and breath sounds normal. No stridor. No respiratory distress. She has no wheezes. She has no rales. She exhibits no tenderness.  Abdominal: Soft. Bowel sounds are normal. She exhibits no distension and no mass. There is no tenderness. There is no rebound and no guarding.  Musculoskeletal: Normal range of motion. She exhibits no edema  and no tenderness.  Lymphadenopathy:    She has no cervical adenopathy.  Neurological: She is alert and oriented to person, place, and time. She has normal reflexes. No cranial nerve deficit. She exhibits normal muscle tone. Coordination normal.  Skin: Skin is warm and dry. No rash noted. No erythema. No pallor.  Psychiatric: She has a normal mood and affect. Her behavior is normal. Judgment and thought content normal.    ED Course  Procedures (including critical care time) Labs Review Labs Reviewed  BASIC METABOLIC PANEL - Abnormal; Notable for the following:    Glucose, Bld 106 (*)    GFR calc non Af Amer 87 (*)    All other components within normal limits  URINALYSIS, ROUTINE W REFLEX MICROSCOPIC - Abnormal; Notable for the following:    APPearance CLOUDY (*)    All other components within normal limits  CBC  D-DIMER, QUANTITATIVE  I-STAT TROPOININ, ED  POC URINE PREG, ED    Imaging Review Dg Chest 2 View  02/08/2014   CLINICAL DATA:  Anterior chest pain  EXAM: CHEST  2 VIEW  COMPARISON:  DG CHEST 2 VIEW dated 06/30/2013  FINDINGS: The heart size and mediastinal contours are within normal limits. Both lungs are clear. The visualized skeletal structures are unremarkable.  IMPRESSION: No active cardiopulmonary disease.   Electronically Signed   By: Kathreen Devoid   On: 02/08/2014 03:48     EKG Interpretation   Date/Time:  Thursday February 08 2014 03:15:36 EDT Ventricular Rate:  71 PR Interval:  150 QRS Duration: 74 QT Interval:  381 QTC Calculation: 414 R Axis:   68 Text Interpretation:  Sinus arrhythmia RSR' in V1 or V2, probably normal  variant No significant change since last tracing Confirmed by Dayvion Sans  MD,  Lennox Leikam ([id number]) on 02/08/2014 3:25:11 AM      MDM   Final diagnoses:  Chronic headache  Atypical chest pain    23 year old female with chest pressure and shortness of breath.  Tonight.  She does have a risk factor of immobilization with an hour train ride, we'll  check d-dimer.  EKG, and vitals are normal.  Aside from slightly low oxygen at 93%.  She is in no acute distress.  Patient is also complaining of chronic headaches.  She is concern for brain cancer.  She has a normal exam and, no red flags in her history to indicate need for emergent imaging.  Will refer to headache wellness [facility] and/or neurology.  4:20 AM Pt feeling better, headache resolved, chest pain resolved.  Pt had n/v just as medications were infused, but no longer nauseated.  Labs, cxr, ekg normal.  Kalman Drape, MD 02/08/14 [phone]

## 2014-02-25 ENCOUNTER — Encounter (HOSPITAL_COMMUNITY): Payer: Self-pay

## 2014-02-25 ENCOUNTER — Inpatient Hospital Stay (HOSPITAL_COMMUNITY)
Admission: AD | Admit: 2014-02-25 | Discharge: 2014-02-26 | Disposition: A | Discharge status: Home / Self Care | Payer: Medicaid Other | Source: Ambulatory Visit | Attending: Obstetrics & Gynecology | Admitting: Obstetrics & Gynecology

## 2014-02-25 DIAGNOSIS — Z3202 Encounter for pregnancy test, result negative: Secondary | ICD-10-CM | POA: Insufficient documentation

## 2014-02-25 DIAGNOSIS — Z87891 Personal history of nicotine dependence: Secondary | ICD-10-CM | POA: Insufficient documentation

## 2014-02-25 DIAGNOSIS — R109 Unspecified abdominal pain: Secondary | ICD-10-CM | POA: Insufficient documentation

## 2014-02-25 DIAGNOSIS — M549 Dorsalgia, unspecified: Secondary | ICD-10-CM | POA: Insufficient documentation

## 2014-02-25 DIAGNOSIS — N912 Amenorrhea, unspecified: Secondary | ICD-10-CM | POA: Insufficient documentation

## 2014-02-25 LAB — URINALYSIS, ROUTINE W REFLEX MICROSCOPIC
BILIRUBIN URINE: NEGATIVE
GLUCOSE, UA: NEGATIVE mg/dL
KETONES UR: NEGATIVE mg/dL
LEUKOCYTES UA: NEGATIVE
Nitrite: NEGATIVE
PH: 6 (ref 5.0–8.0)
Protein, ur: NEGATIVE mg/dL
Specific Gravity, Urine: 1.02 (ref 1.005–1.030)
Urobilinogen, UA: 0.2 mg/dL (ref 0.0–1.0)

## 2014-02-25 LAB — URINE MICROSCOPIC-ADD ON

## 2014-02-25 LAB — POCT PREGNANCY, URINE: Preg Test, Ur: NEGATIVE

## 2014-02-25 LAB — WET PREP, GENITAL
Clue Cells Wet Prep HPF POC: NONE SEEN
TRICH WET PREP: NONE SEEN
YEAST WET PREP: NONE SEEN

## 2014-02-25 LAB — HCG, QUANTITATIVE, PREGNANCY: hCG, Beta Chain, Quant, S: <1 m[IU]/mL (ref <5)

## 2014-02-25 NOTE — MAU Provider Note (Signed)
History     CSN: [id number]  Arrival date and time: 02/25/14 2209   First Provider Initiated Contact with Patient 02/25/14 2259      Chief Complaint  Patient presents with  . Back Pain  . Possible Pregnancy  . Abdominal Pain   HPI [name] is a 23 y.o. [phone] who presents to MAU today with complaint of amenorrhea and abdominal/back pain. The patient states LMP of 01/18/14. She reports negative HPT. She has had mild lower abdominal cramping and low back pain occasionally. She denies radiation of pain. She denies dysuria, discharge, vaginal bleeding or N/V/D. She does endorse frequency of urination. She also reports chronic headaches usually relieved by Aleve. No headache now. No PCP, patient states that she is new to the area although she has records in our system dating back to 2011.   OB History   Grav Para Term Preterm Abortions TAB SAB Ect Mult Living   3 1 1  2  2   1       Past Medical History  Diagnosis Date  . No pertinent past medical history   . HPV in female   . Chlamydia   . Gonorrhea   . Abnormal Pap smear     Past Surgical History  Procedure Laterality Date  . No past surgeries      Family History  Problem Relation Age of Onset  . Alcohol abuse Mother   . Depression Mother   . Drug abuse Mother   . Heart disease Mother     mother had a closed valve and had heart surgery  . Alcohol abuse Father   . Drug abuse Father     History  Substance Use Topics  . Smoking status: Former Smoker -- 0.00 packs/day    Types: Cigarettes  . Smokeless tobacco: Never Used  . Alcohol Use: Yes     Comment: 1-2 glasses of wine per week    Allergies:  Allergies  Allergen Reactions  . Lorazepam Anxiety    Hallucination     Prescriptions prior to admission  Medication Sig Dispense Refill  . acetaminophen (TYLENOL) 500 MG tablet Take 1,000 mg by mouth every 6 (six) hours as needed for headache.        Review of Systems  Constitutional: Negative for  fever and malaise/fatigue.  Gastrointestinal: Positive for abdominal pain. Negative for nausea, vomiting, diarrhea and constipation.  Genitourinary: Positive for frequency. Negative for dysuria and urgency.       Neg - vaginal bleeding, discharge   Physical Exam   Blood pressure 114/69, pulse 92, temperature 98.4 F (36.9 C), temperature source Oral, resp. rate 16, height 5\' 5"  (1.651 m), weight 140 lb (63.504 kg), last menstrual period 01/18/2014, SpO2 100.00%.  Physical Exam  Constitutional: She is oriented to person, place, and time. She appears well-developed and well-nourished. No distress.  HENT:  Head: Normocephalic and atraumatic.  Cardiovascular: Normal rate.   Respiratory: Effort normal.  GI: Soft. She exhibits no distension and no mass. There is no tenderness. There is no rebound and no guarding.  Genitourinary: Uterus is tender (mild). Uterus is not enlarged. Cervix exhibits no motion tenderness, no discharge and no friability. Right adnexum displays tenderness (mild). Right adnexum displays no mass. Left adnexum displays tenderness (mild). Left adnexum displays no mass. No bleeding around the vagina. Vaginal discharge (scant thin, frothy discharge noted) found.  Neurological: She is alert and oriented to person, place, and time.  Skin: Skin is warm and dry.  No erythema.  Psychiatric: She has a normal mood and affect.   Results for orders placed during the hospital encounter of 02/25/14 (from the past 24 hour(s))  URINALYSIS, ROUTINE W REFLEX MICROSCOPIC     Status: Abnormal   Collection Time    02/25/14 10:25 PM      Result Value Ref Range   Color, Urine YELLOW  YELLOW   APPearance CLEAR  CLEAR   Specific Gravity, Urine 1.020  1.005 - 1.030   pH 6.0  5.0 - 8.0   Glucose, UA NEGATIVE  NEGATIVE mg/dL   Hgb urine dipstick TRACE (*) NEGATIVE   Bilirubin Urine NEGATIVE  NEGATIVE   Ketones, ur NEGATIVE  NEGATIVE mg/dL   Protein, ur NEGATIVE  NEGATIVE mg/dL   Urobilinogen,  UA 0.2  0.0 - 1.0 mg/dL   Nitrite NEGATIVE  NEGATIVE   Leukocytes, UA NEGATIVE  NEGATIVE  URINE MICROSCOPIC-ADD ON     Status: Abnormal   Collection Time    02/25/14 10:25 PM      Result Value Ref Range   Squamous Epithelial / LPF FEW (*) RARE   RBC / HPF 0-2  <3 RBC/hpf   Bacteria, UA RARE  RARE  POCT PREGNANCY, URINE     Status: None   Collection Time    02/25/14 10:33 PM      Result Value Ref Range   Preg Test, Ur NEGATIVE  NEGATIVE  HCG, QUANTITATIVE, PREGNANCY     Status: None   Collection Time    02/25/14 11:10 PM      Result Value Ref Range   hCG, Beta Chain, Quant, S <1  <5 mIU/mL  WET PREP, GENITAL     Status: Abnormal   Collection Time    02/25/14 11:19 PM      Result Value Ref Range   Yeast Wet Prep HPF POC NONE SEEN  NONE SEEN   Trich, Wet Prep NONE SEEN  NONE SEEN   Clue Cells Wet Prep HPF POC NONE SEEN  NONE SEEN   WBC, Wet Prep HPF POC FEW (*) NONE SEEN    MAU Course  Procedures None  MDM UPT - negative UA, wet prep, GC/chlamydia and quant hCG today  Assessment and Plan  A: Negative pregnancy test Secondary amenorrhea  P: Discharge home Advised continued Aleve or Tylenol PRN pain GC/Chlamydia pending Patient advised to wait 1-2 weeks and take HPT and follow-up accordingly Resources given for PCP, [facility] and [facility] for follow-up if continued amenorrhea or irregular menses Patient may return to MAU as needed or if her condition were to change or worsen  Farris Has, PA-C  02/26/2014, 12:02 AM

## 2014-02-25 NOTE — MAU Note (Signed)
Pt reports lower back pain and occasional lower abd cramping, LMP 01/19/2014, negative preg test at home this week. Severe headaches for the last 2 weeks.

## 2014-02-26 DIAGNOSIS — N912 Amenorrhea, unspecified: Secondary | ICD-10-CM

## 2014-02-26 LAB — GC/CHLAMYDIA PROBE AMP
CT Probe RNA: NEGATIVE
GC PROBE AMP APTIMA: NEGATIVE

## 2014-02-26 NOTE — Discharge Instructions (Signed)
Secondary Amenorrhea   Secondary amenorrhea is the stopping of menstrual flow for 3 6 months in a female who has previously had periods. There are many possible causes. Most of these causes are not serious. Usually, treating the underlying problem causing the loss of menses will return your periods to normal.  CAUSES   Some common and uncommon causes of not menstruating include:   Malnutrition.   Low blood sugar (hypoglycemia).   Polycystic ovary disease.   Stress or fear.   Breastfeeding.   Hormone imbalance.   Ovarian failure.   Medicines.   Extreme obesity.   Cystic fibrosis.   Low body weight or drastic weight reduction from any cause.   Early menopause.   Removal of ovaries or uterus.   Contraceptives.   Illness.   Long-term (chronic) illnesses.   Cushing syndrome.   Thyroid problems.   Birth control pills, patches, or vaginal rings for birth control.  RISK FACTORS  You may be at greater risk of secondary amenorrhea if:   You have a family history of this condition.   You have an eating disorder.   You do athletic training.  DIAGNOSIS   A diagnosis is made by your health care provider taking a medical history and doing a physical exam. This will include a pelvic exam to check for problems with your reproductive organs. Pregnancy must be ruled out. Often, numerous blood tests are done to measure different hormones in the body. Urine testing may be done. Specialized exams (ultrasound, CT scan, MRI, or hysteroscopy) may have to be done as well as measuring the body mass index (BMI).  TREATMENT   Treatment depends on the cause of the amenorrhea. If an eating disorder is present, this can be treated with an adequate diet and therapy. Chronic illnesses may improve with treatment of the illness. Amenorrhea may be corrected with medicines, lifestyle changes, or surgery. If the amenorrhea cannot be corrected, it is sometimes possible to create a false menstruation with medicines.  HOME CARE  INSTRUCTIONS   Maintain a healthy diet.   Manage weight problems.   Exercise regularly but not excessively.   Get adequate sleep.   Manage stress.   Be aware of changes in your menstrual cycle. Keep a record of when your periods occur. Note the date your period starts, how long it lasts, and any problems.  SEEK MEDICAL CARE IF:  Your symptoms do not get better with treatment.  Document Released: 11/23/2006 Document Revised: 06/14/2013 Document Reviewed: 03/30/2013  [employer] Patient Information 2014 [employer].

## 2014-02-26 NOTE — MAU Provider Note (Signed)
Attestation of Attending Supervision of Advanced Practitioner (CNM/NP): Evaluation and management procedures were performed by the Advanced Practitioner under my supervision and collaboration. I have reviewed the Advanced Practitioner's note and chart, and I agree with the management and plan.  Guss Bunde 7:58 AM

## 2014-04-11 ENCOUNTER — Emergency Department (HOSPITAL_COMMUNITY)
Admission: EM | Admit: 2014-04-11 | Discharge: 2014-04-11 | Discharge status: Against Medical Advice | Payer: Self-pay | Attending: Emergency Medicine | Admitting: Emergency Medicine

## 2014-04-11 ENCOUNTER — Encounter (HOSPITAL_COMMUNITY): Payer: Self-pay | Admitting: Emergency Medicine

## 2014-04-11 DIAGNOSIS — IMO0002 Reserved for concepts with insufficient information to code with codable children: Secondary | ICD-10-CM | POA: Insufficient documentation

## 2014-04-11 DIAGNOSIS — Z8619 Personal history of other infectious and parasitic diseases: Secondary | ICD-10-CM | POA: Insufficient documentation

## 2014-04-11 DIAGNOSIS — F419 Anxiety disorder, unspecified: Secondary | ICD-10-CM

## 2014-04-11 DIAGNOSIS — M545 Low back pain, unspecified: Secondary | ICD-10-CM | POA: Insufficient documentation

## 2014-04-11 DIAGNOSIS — Z3202 Encounter for pregnancy test, result negative: Secondary | ICD-10-CM | POA: Insufficient documentation

## 2014-04-11 DIAGNOSIS — F410 Panic disorder [episodic paroxysmal anxiety] without agoraphobia: Secondary | ICD-10-CM | POA: Insufficient documentation

## 2014-04-11 DIAGNOSIS — M549 Dorsalgia, unspecified: Secondary | ICD-10-CM

## 2014-04-11 DIAGNOSIS — Z87891 Personal history of nicotine dependence: Secondary | ICD-10-CM | POA: Insufficient documentation

## 2014-04-11 DIAGNOSIS — R350 Frequency of micturition: Secondary | ICD-10-CM | POA: Insufficient documentation

## 2014-04-11 HISTORY — DX: Anxiety disorder, unspecified: F41.9

## 2014-04-11 LAB — URINALYSIS, ROUTINE W REFLEX MICROSCOPIC
BILIRUBIN URINE: NEGATIVE
Glucose, UA: NEGATIVE mg/dL
Hgb urine dipstick: NEGATIVE
Ketones, ur: NEGATIVE mg/dL
Leukocytes, UA: NEGATIVE
NITRITE: NEGATIVE
Protein, ur: NEGATIVE mg/dL
SPECIFIC GRAVITY, URINE: 1.013 (ref 1.005–1.030)
Urobilinogen, UA: 0.2 mg/dL (ref 0.0–1.0)
pH: 6.5 (ref 5.0–8.0)

## 2014-04-11 LAB — PREGNANCY, URINE: Preg Test, Ur: NEGATIVE

## 2014-04-11 MED ORDER — IBUPROFEN 800 MG PO TABS
800.0000 mg | ORAL_TABLET | Freq: Once | ORAL | Status: DC
  Filled 2014-04-11: qty 1

## 2014-04-11 MED ORDER — OXYCODONE-ACETAMINOPHEN 5-325 MG PO TABS
1.0000 | ORAL_TABLET | Freq: Once | ORAL | Status: AC
  Administered 2014-04-11: 1 via ORAL
  Filled 2014-04-11: qty 1

## 2014-04-11 MED ORDER — LORAZEPAM 1 MG PO TABS
1.0000 mg | ORAL_TABLET | Freq: Once | ORAL | Status: DC
  Filled 2014-04-11: qty 1

## 2014-04-11 NOTE — ED Notes (Signed)
Patient visualized leaving through ED front door with visitor.

## 2014-04-11 NOTE — ED Notes (Signed)
PT states that she has had rt side lower back / buttock pain x 2 weeks; pt states that it has gotten progressively worse and now is "unbearable"; pt able to ambulate and move without difficulty; pt also c/o chest heaviness off and on for awhile that has gotten worse as the back pain has progressed; pt anxious in triage

## 2014-04-11 NOTE — ED Notes (Signed)
Pt called out and said that her heart was racing. Pt on cardiac monitor and heart rate was 115. Pt instructed to relax. HR went down to 97. Pt states this episode lasted "a couple of minuets". RN F. Jodell Cipro made aware

## 2014-04-11 NOTE — ED Provider Notes (Signed)
CSN: [id number]     Arrival date & time 04/11/14  0355 History   None    Chief Complaint  Patient presents with  . Back Pain  . Anxiety   HPI  Brandy Kidd is a 23 y.o. female with a PMH of anxiety, HPV, chlamydia, gonorrhea, and abnormal Pap smear who presents to the ED for evaluation of back pain and anxiety. History was provided by the patient. Patient states she has had right lower back pain for the past two weeks. Her pain is worse with bending to the side. Her pain comes and goes. She denies any injuries or trauma. She has not taken anything for pain PTA. She denies any similar pain in the past. She denies any dysuria but has had urinary frequency. No hematuria, abdominal pain, nausea, emesis, constipation, diarrhea, loss of bowel/bladder function, weakness, loss of sensation, numbness/tingling. No hx of cancer or IV drug use. Patient also complains of panic attacks with hx of anxiety. States previously was on xanax for this, but ran out of her medication. She states that she previously had a panic attack but this has improved. She complains of chest tightness and palpitations but denies this currently. No chest pain, SOB, dyspnea.    Past Medical History  Diagnosis Date  . No pertinent past medical history   . HPV in female   . Chlamydia   . Gonorrhea   . Abnormal Pap smear   . Anxiety    Past Surgical History  Procedure Laterality Date  . No past surgeries     Family History  Problem Relation Age of Onset  . Alcohol abuse Mother   . Depression Mother   . Drug abuse Mother   . Heart disease Mother     mother had a closed valve and had heart surgery  . Alcohol abuse Father   . Drug abuse Father    History  Substance Use Topics  . Smoking status: Former Smoker -- 0.00 packs/day    Types: Cigarettes  . Smokeless tobacco: Never Used  . Alcohol Use: Yes     Comment: 1-2 glasses of wine per week   OB History   Grav Para Term Preterm Abortions TAB SAB Ect Mult Living   3  1 1  2  2   1      Review of Systems  Constitutional: Negative for fever, chills, diaphoresis, activity change, appetite change and fatigue.  HENT: Negative for congestion, rhinorrhea and sore throat.   Respiratory: Positive for chest tightness (resolved). Negative for cough and shortness of breath.   Cardiovascular: Positive for palpitations (resolved). Negative for chest pain and leg swelling.  Gastrointestinal: Negative for nausea, vomiting, abdominal pain, diarrhea and constipation.  Genitourinary: Positive for frequency. Negative for dysuria, urgency, hematuria, decreased urine volume, vaginal bleeding, vaginal discharge, difficulty urinating and vaginal pain.  Musculoskeletal: Positive for back pain. Negative for arthralgias, gait problem, myalgias and neck pain.  Skin: Negative for rash.  Neurological: Negative for dizziness, weakness, light-headedness, numbness and headaches.  Psychiatric/Behavioral: Positive for agitation. The patient is nervous/anxious.     Allergies  Lorazepam  Home Medications   Prior to Admission medications   Medication Sig Start Date End Date Taking? Authorizing Provider  naproxen sodium (ANAPROX) 220 MG tablet Take 220 mg by mouth 2 (two) times daily as needed (for pain.).   Yes Historical Provider, MD   BP 125/72  Pulse 83  Temp(Src) 98.1 F (36.7 C) (Oral)  Resp 22  SpO2 99%  LMP 03/19/2014  Filed Vitals:   04/11/14 0410 04/11/14 0500 04/11/14 0600 04/11/14 0720  BP: 125/72 123/69 111/58 125/85  Pulse: 83   80  Temp: 98.1 F (36.7 C)     TempSrc: Oral     Resp: 22 25  18   SpO2: 99% 100%  100%    Physical Exam  Nursing note and vitals reviewed. Constitutional: She is oriented to person, place, and time. She appears well-developed and well-nourished. No distress.  HENT:  Head: Normocephalic and atraumatic.  Right Ear: External ear normal.  Left Ear: External ear normal.  Mouth/Throat: Oropharynx is clear and moist.  Eyes:  Conjunctivae are normal. Right eye exhibits no discharge. Left eye exhibits no discharge.  Neck: Normal range of motion. Neck supple.  Cardiovascular: Normal rate, regular rhythm and normal heart sounds.  Exam reveals no gallop and no friction rub.   No murmur heard. Dorsalis pedis pulses present and equal bilaterally  Pulmonary/Chest: Effort normal and breath sounds normal. No respiratory distress. She has no wheezes. She has no rales. She exhibits no tenderness.  Abdominal: Soft. She exhibits no distension and no mass. There is no tenderness. There is no rebound and no guarding.  Musculoskeletal: Normal range of motion. She exhibits no edema and no tenderness.       Arms: Tenderness to palpation to the right paraspinal lumbar region. Pain exacerbated with lateral rotation. Patient able to ambulate without difficulty or ataxia. Strength 5/5 in the upper and lower extremities bilaterally. No LE edema or calf tenderness bilaterally  Neurological: She is alert and oriented to person, place, and time.  Patellar reflexes intact bilaterally. Gross sensation intact in the LE bilaterally  Skin: Skin is warm and dry. She is not diaphoretic.     ED Course  Procedures (including critical care time) Labs Review Labs Reviewed  URINALYSIS, ROUTINE W REFLEX MICROSCOPIC  PREGNANCY, URINE    Imaging Review No results found.   EKG Interpretation None       Date: 04/11/2014  Rate: 108  Rhythm: sinus tachycardia  QRS Axis: normal  Intervals: normal  ST/T Wave abnormalities: normal  Conduction Disutrbances:none  Narrative Interpretation:   Old EKG Reviewed: unchanged   Results for orders placed during the hospital encounter of 04/11/14  URINALYSIS, ROUTINE W REFLEX MICROSCOPIC      Result Value Ref Range   Color, Urine YELLOW  YELLOW   APPearance CLEAR  CLEAR   Specific Gravity, Urine 1.013  1.005 - 1.030   pH 6.5  5.0 - 8.0   Glucose, UA NEGATIVE  NEGATIVE mg/dL   Hgb urine dipstick  NEGATIVE  NEGATIVE   Bilirubin Urine NEGATIVE  NEGATIVE   Ketones, ur NEGATIVE  NEGATIVE mg/dL   Protein, ur NEGATIVE  NEGATIVE mg/dL   Urobilinogen, UA 0.2  0.0 - 1.0 mg/dL   Nitrite NEGATIVE  NEGATIVE   Leukocytes, UA NEGATIVE  NEGATIVE  PREGNANCY, URINE      Result Value Ref Range   Preg Test, Ur NEGATIVE  NEGATIVE     MDM   Kassondra Geil is a 23 y.o. female with a PMH of anxiety, HPV, chlamydia, gonorrhea, and abnormal Pap smear who presents to the ED for evaluation of back pain and anxiety  Rechecks  7:00 AM = Patient states she is very anxious. Ordering 1 mg Ativan PO. Has had this in the past with no reactions. Also ordering Ibuprofen for pain.  7:30 AM = Patient upset about getting Ibuprofen and Ativan. States "I have  been here for hours and I could have just took an aspirin at home." Ordering Percocet. Canceling Ativan and Ibuprofen.  8:20 AM = RN informed me patient eloped ED. Seen walking out of front door.    Etiology of back pain likely musculoskeletal in nature. Possibly from muscular strain. Patient neurovascularly intact. No hx of injury or trauma. Pain reproducible on exam. No warning signs or symptoms of back pain including loss of bowel or bladder control, night sweats, waking from sleep with back pain, unexplained fevers or weight loss, history of cancer, or IV drug use. No concern for cauda equina, epidural abscess, or other serious/life threatening cause of back pain. UA negative for hematuria or UTI. Patient eloped ED before re-assessment or discharge instructions. Also complained of anxiety. EKG negative for any acute ischemic changes. No chest pain or SOB. Patient has hx of anxiety and has run out of her xanax prescription. Vital signs stable.     Final impressions: 1. Back pain   2. Anxiety       [place], [place] 04/11/14 [phone]

## 2014-04-13 NOTE — ED Provider Notes (Signed)
Medical screening examination/treatment/procedure(s) were performed by non-physician practitioner and as supervising physician I was immediately available for consultation/collaboration.   EKG Interpretation   Date/Time:  Wednesday April 11 2014 04:03:33 EDT Ventricular Rate:  108 PR Interval:  128 QRS Duration: 88 QT Interval:  322 QTC Calculation: 432 R Axis:   72 Text Interpretation:  Sinus tachycardia Ventricular premature complex RSR'  in V1 or V2, probably normal variant Baseline wander in lead(s) I ED  PHYSICIAN INTERPRETATION AVAILABLE IN [facility] Confirmed by TEST,  Record ([id number]) on 04/13/2014 8:29:42 AM        Julianne Rice, MD 04/13/14 2342

## 2014-04-15 ENCOUNTER — Encounter (HOSPITAL_COMMUNITY): Payer: Self-pay | Admitting: Emergency Medicine

## 2014-04-15 ENCOUNTER — Emergency Department (HOSPITAL_COMMUNITY)
Admission: EM | Admit: 2014-04-15 | Discharge: 2014-04-15 | Disposition: A | Discharge status: Home / Self Care | Payer: Self-pay | Attending: Emergency Medicine | Admitting: Emergency Medicine

## 2014-04-15 DIAGNOSIS — F1210 Cannabis abuse, uncomplicated: Secondary | ICD-10-CM | POA: Insufficient documentation

## 2014-04-15 DIAGNOSIS — M549 Dorsalgia, unspecified: Secondary | ICD-10-CM | POA: Insufficient documentation

## 2014-04-15 DIAGNOSIS — R0789 Other chest pain: Secondary | ICD-10-CM

## 2014-04-15 DIAGNOSIS — Z87891 Personal history of nicotine dependence: Secondary | ICD-10-CM | POA: Insufficient documentation

## 2014-04-15 DIAGNOSIS — IMO0002 Reserved for concepts with insufficient information to code with codable children: Secondary | ICD-10-CM | POA: Insufficient documentation

## 2014-04-15 DIAGNOSIS — Z8619 Personal history of other infectious and parasitic diseases: Secondary | ICD-10-CM | POA: Insufficient documentation

## 2014-04-15 DIAGNOSIS — R071 Chest pain on breathing: Secondary | ICD-10-CM | POA: Insufficient documentation

## 2014-04-15 LAB — RAPID URINE DRUG SCREEN, HOSP PERFORMED
Amphetamines: NOT DETECTED
BARBITURATES: NOT DETECTED
Benzodiazepines: NOT DETECTED
COCAINE: NOT DETECTED
Opiates: NOT DETECTED
TETRAHYDROCANNABINOL: POSITIVE — AB

## 2014-04-15 MED ORDER — IBUPROFEN 600 MG PO TABS: 600.0000 mg | ORAL_TABLET | Freq: Four times a day (QID) | ORAL | Status: DC | PRN

## 2014-04-15 NOTE — ED Notes (Signed)
PT states that she is concerned about her chest and that nobody has done anything; RN attempted to explain to pt that she has had an EKG that was negative and that has been evaluated; pt states "I still hurt"; RN advised that she would be advised to follow up with her PCP if she continues to have pain; pt states that "I am having an emergency and you are not doing anything about it"; pt advised that she has been evaluated by the emergency room and was ready for discharge; pt asked to see somebody else; Dr Jeneen Rinks advised of pt's concerns and he advised that no further actions were needed and pt is stable for DC.

## 2014-04-15 NOTE — Discharge Instructions (Signed)
Take ibuprofen as directed as needed for pain.  Chest Wall Pain Chest wall pain is pain in or around the bones and muscles of your chest. It may take up to 6 weeks to get better. It may take longer if you must stay physically active in your work and activities.  CAUSES  Chest wall pain may happen on its own. However, it may be caused by:  A viral illness like the flu.  Injury.  Coughing.  Exercise.  Arthritis.  Fibromyalgia.  Shingles. HOME CARE INSTRUCTIONS   Avoid overtiring physical activity. Try not to strain or perform activities that cause pain. This includes any activities using your chest or your abdominal and side muscles, especially if heavy weights are used.  Put ice on the sore area.  Put ice in a plastic bag.  Place a towel between your skin and the bag.  Leave the ice on for 15-20 minutes per hour while awake for the first 2 days.  Only take over-the-counter or prescription medicines for pain, discomfort, or fever as directed by your caregiver. SEEK IMMEDIATE MEDICAL CARE IF:   Your pain increases, or you are very uncomfortable.  You have a fever.  Your chest pain becomes worse.  You have new, unexplained symptoms.  You have nausea or vomiting.  You feel sweaty or lightheaded.  You have a cough with phlegm (sputum), or you cough up blood. MAKE SURE YOU:   Understand these instructions.  Will watch your condition.  Will get help right away if you are not doing well or get worse. Document Released: 10/12/2005 Document Revised: 01/04/2012 Document Reviewed: 06/08/2011 [facility] Patient Information 2015 [place], [place]. This information is not intended to replace advice given to you by your health care provider. Make sure you discuss any questions you have with your health care provider.

## 2014-04-15 NOTE — ED Provider Notes (Signed)
Medical screening examination/treatment/procedure(s) were performed by non-physician practitioner and as supervising physician I was immediately available for consultation/collaboration.   EKG Interpretation   Date/Time:  Sunday April 15 2014 05:43:35 EDT Ventricular Rate:  84 PR Interval:  137 QRS Duration: 75 QT Interval:  374 QTC Calculation: 442 R Axis:   67 Text Interpretation:  Sinus rhythm Baseline wander in lead(s) V2 When  compared with ECG of 04/11/2014 No significant change was found Confirmed  by [facility]  MD, KATHLEEN ([id number]) on 04/15/2014 6:47:03 AM        Alfonzo Feller, DO 04/15/14 1531

## 2014-04-15 NOTE — ED Provider Notes (Signed)
CSN: [id number]     Arrival date & time 04/15/14  0533 History   First MD Initiated Contact with Patient 04/15/14 0601     Chief Complaint  Patient presents with  . Chest Pain     (Consider location/radiation/quality/duration/timing/severity/associated sxs/prior Treatment) HPI Comments: 23 year old female with a past medical history of anxiety, gonorrhea and Chlamydia presents to the emergency department complaining of intermittent chest pain x1 day, stating constant about one hour prior to arrival. Pain located on the right side of her chest described as a pressure, nonradiating. States when she lays flat she feels like she cannot take a breath. She has not tried any alleviating factors for her symptoms. She was seen in the emergency department 4 days ago for back pain and anxiety. It is noted she awoke this at that visit because she was offered ibuprofen and Ativan which she did not approve of. States this does not feel like her typical anxiety attack. Admits to being under increased stress. Denies fever, chills, cough, nausea, vomiting, abdominal pain. She is also complaining of right-sided low back pain, the same pain she was seen for 4 days ago. She was offered ibuprofen at that visit but refused to take this. Denies pain, numbness or tingling radiating down her extremities. No loss of control bowels or bladder or saddle anesthesia. Denies hx of CA or IVDA. The urine rapid drug screen was obtained on patient arrival prior to being seen, positive for THC. When asking patient about any drug use, she denies, when mentioning the positive THC, pt says she may have 1 month ago.  Patient is a 23 y.o. female presenting with chest pain. The history is provided by the patient.  Chest Pain Associated symptoms: back pain     Past Medical History  Diagnosis Date  . No pertinent past medical history   . HPV in female   . Chlamydia   . Gonorrhea   . Abnormal Pap smear   . Anxiety    Past Surgical  History  Procedure Laterality Date  . No past surgeries     Family History  Problem Relation Age of Onset  . Alcohol abuse Mother   . Depression Mother   . Drug abuse Mother   . Heart disease Mother     mother had a closed valve and had heart surgery  . Alcohol abuse Father   . Drug abuse Father    History  Substance Use Topics  . Smoking status: Former Smoker -- 0.00 packs/day    Types: Cigarettes  . Smokeless tobacco: Never Used  . Alcohol Use: Yes     Comment: 1-2 glasses of wine per week   OB History   Grav Para Term Preterm Abortions TAB SAB Ect Mult Living   3 1 1  2  2   1      Review of Systems  Cardiovascular: Positive for chest pain.  Musculoskeletal: Positive for back pain.  All other systems reviewed and are negative.     Allergies  Lorazepam  Home Medications   Prior to Admission medications   Medication Sig Start Date End Date Taking? Authorizing Provider  naproxen sodium (ANAPROX) 220 MG tablet Take 220 mg by mouth 2 (two) times daily as needed (for pain.).   Yes Historical Provider, MD  ibuprofen (ADVIL,MOTRIN) 600 MG tablet Take 1 tablet (600 mg total) by mouth every 6 (six) hours as needed. 04/15/14   Illene Labrador, PA-C   BP 121/68  Pulse 60  Temp(Src) 97.6 F (36.4 C) (Oral)  Resp 15  SpO2 100%  LMP 03/19/2014 Physical Exam  Nursing note and vitals reviewed. Constitutional: She is oriented to person, place, and time. She appears well-developed and well-nourished. No distress.  HENT:  Head: Normocephalic and atraumatic.  Eyes: Conjunctivae are normal.  Neck: Normal range of motion.  Pulmonary/Chest: Effort normal. She exhibits tenderness (mid-sternal).  Musculoskeletal: She exhibits no edema.  Neurological: She is alert and oriented to person, place, and time.  Psychiatric: Her affect is blunt. She is agitated.    ED Course  Procedures (including critical care time) Labs Review Labs Reviewed  URINE RAPID DRUG SCREEN (HOSP  PERFORMED) - Abnormal; Notable for the following:    Tetrahydrocannabinol POSITIVE (*)    All other components within normal limits    Imaging Review No results found.   EKG Interpretation   Date/Time:  Sunday April 15 2014 05:43:35 EDT Ventricular Rate:  84 PR Interval:  137 QRS Duration: 75 QT Interval:  374 QTC Calculation: 442 R Axis:   67 Text Interpretation:  Sinus rhythm Baseline wander in lead(s) V2 When  compared with ECG of 04/11/2014 No significant change was found Confirmed  by [facility]  MD, KATHLEEN ([id number]) on 04/15/2014 6:47:03 AM      MDM   Final diagnoses:  Chest wall pain   Pt presenting with CP. Well appearing and in NAD. Afebrile, VSS. On evaluation, after palpation of mid-sternal region, pt pushes my hand away and says "don't push there the PA student already did that". She refuses any further evaluation and becomes very agitated. I told her I would give her ibuprofen for her chest wall pain and she says "whatever", begins cursing, and leaves the ED.  Illene Labrador, PA-C 04/15/14 [place], PA-C 04/15/14 [phone]

## 2014-04-15 NOTE — ED Notes (Signed)
Patient states that she has had chest pain on and off all day. About an hour ago she started to have the chest and it has remained constant. When she lays back she can not breath.

## 2014-08-27 ENCOUNTER — Encounter (HOSPITAL_COMMUNITY): Payer: Self-pay | Admitting: Emergency Medicine

## 2015-09-21 ENCOUNTER — Emergency Department (HOSPITAL_COMMUNITY)
Admission: EM | Admit: 2015-09-21 | Discharge: 2015-09-21 | Disposition: A | Discharge status: Home / Self Care | Payer: Self-pay | Attending: Emergency Medicine | Admitting: Emergency Medicine

## 2015-09-21 ENCOUNTER — Encounter (HOSPITAL_COMMUNITY): Payer: Self-pay | Admitting: Emergency Medicine

## 2015-09-21 DIAGNOSIS — Z3202 Encounter for pregnancy test, result negative: Secondary | ICD-10-CM | POA: Insufficient documentation

## 2015-09-21 DIAGNOSIS — Z87891 Personal history of nicotine dependence: Secondary | ICD-10-CM | POA: Insufficient documentation

## 2015-09-21 DIAGNOSIS — Z8619 Personal history of other infectious and parasitic diseases: Secondary | ICD-10-CM | POA: Insufficient documentation

## 2015-09-21 DIAGNOSIS — Z79899 Other long term (current) drug therapy: Secondary | ICD-10-CM | POA: Insufficient documentation

## 2015-09-21 DIAGNOSIS — Z046 Encounter for general psychiatric examination, requested by authority: Secondary | ICD-10-CM

## 2015-09-21 DIAGNOSIS — F919 Conduct disorder, unspecified: Secondary | ICD-10-CM | POA: Insufficient documentation

## 2015-09-21 DIAGNOSIS — Z8541 Personal history of malignant neoplasm of cervix uteri: Secondary | ICD-10-CM | POA: Insufficient documentation

## 2015-09-21 DIAGNOSIS — R258 Other abnormal involuntary movements: Secondary | ICD-10-CM | POA: Insufficient documentation

## 2015-09-21 DIAGNOSIS — F419 Anxiety disorder, unspecified: Secondary | ICD-10-CM | POA: Insufficient documentation

## 2015-09-21 HISTORY — DX: Malignant (primary) neoplasm, unspecified: C80.1

## 2015-09-21 LAB — COMPREHENSIVE METABOLIC PANEL
ALT: 12 U/L — ABNORMAL LOW (ref 14–54)
AST: 22 U/L (ref 15–41)
Albumin: 4.8 g/dL (ref 3.5–5.0)
Alkaline Phosphatase: 29 U/L — ABNORMAL LOW (ref 38–126)
Anion gap: 7 (ref 5–15)
BUN: 14 mg/dL (ref 6–20)
CHLORIDE: 107 mmol/L (ref 101–111)
CO2: 26 mmol/L (ref 22–32)
Calcium: 9.6 mg/dL (ref 8.9–10.3)
Creatinine, Ser: 0.86 mg/dL (ref 0.44–1.00)
GFR calc Af Amer: >60 mL/min (ref >60)
GFR calc non Af Amer: >60 mL/min (ref >60)
Glucose, Bld: 103 mg/dL — ABNORMAL HIGH (ref 65–99)
POTASSIUM: 4 mmol/L (ref 3.5–5.1)
Sodium: 140 mmol/L (ref 135–145)
Total Bilirubin: 0.9 mg/dL (ref 0.3–1.2)
Total Protein: 7.6 g/dL (ref 6.5–8.1)

## 2015-09-21 LAB — CBC
HCT: 39.7 % (ref 36.0–46.0)
Hemoglobin: 13.5 g/dL (ref 12.0–15.0)
MCH: 31.7 pg (ref 26.0–34.0)
MCHC: 34 g/dL (ref 30.0–36.0)
MCV: 93.2 fL (ref 78.0–100.0)
PLATELETS: 288 10*3/uL (ref 150–400)
RBC: 4.26 MIL/uL (ref 3.87–5.11)
RDW: 12.8 % (ref 11.5–15.5)
WBC: 10.5 10*3/uL (ref 4.0–10.5)

## 2015-09-21 LAB — ETHANOL

## 2015-09-21 LAB — SALICYLATE LEVEL: Salicylate Lvl: <4 mg/dL (ref 2.8–30.0)

## 2015-09-21 LAB — ACETAMINOPHEN LEVEL

## 2015-09-21 LAB — HCG, QUANTITATIVE, PREGNANCY

## 2015-09-21 MED ORDER — CLONAZEPAM 0.5 MG PO TABS
1.0000 mg | ORAL_TABLET | Freq: Two times a day (BID) | ORAL | Status: DC
  Administered 2015-09-21: 1 mg via ORAL
  Filled 2015-09-21: qty 2

## 2015-09-21 NOTE — ED Notes (Signed)
Patient voided, states she "forgot to get a sample".

## 2015-09-21 NOTE — ED Notes (Signed)
Patient denies SI, states she was just showing off in front of her boyfriend. Patient is very upset that she is here. She has upcoming court dates in [place] she is concerned about. Patient states she is also scheduled for surgery on 10/02/2015 for her precancerous cervical cells. Patient states she didn't take her anxiety medication (Klonopin 1mg  twice daily), she wanted to see if she could "function" without taking it. Patient initially very upset and shouting. Patient has since then calmed down significantly. Patient is aware of need to dress out, collect blood, and provide urine sample.

## 2015-09-21 NOTE — ED Notes (Signed)
Patient arrives with police. Patient is under IVC. Patient was picked up at a hotel where she was involved in an argument with her boyfriend. Patient was crying and yelling "just shoot me" to police. Per IVC papers patient has a hx of anxiety and depression and has not taken her medication today. Patient boyfriend is in the lobby demanding to see patient. Off duty [employer] speaking to visitor in reference to this, he is now leaving the hospital.

## 2015-09-21 NOTE — ED Provider Notes (Signed)
CSN: [id number]     Arrival date & time 09/21/15  0431 History   First MD Initiated Contact with Patient 09/21/15 [phone]     Chief Complaint  Patient presents with  . ivc      (Consider location/radiation/quality/duration/timing/severity/associated sxs/prior Treatment) HPI Comments: 24 year old female with a history of anxiety presents to the emergency department for further psychiatric evaluation. Patient under IVC taken out by police. Patient reports that she has a history of anxiety, but did not take her prescribed Klonopin today because she wanted to see how she could manage her anxiety without medication. Police were called to the hotel due to the patient having an argument with her boyfriend. Patient was upset because her boyfriend had cheated on her this evening. Patient reports crying and yelling at the cops to shoot her. She reports that she was only trying to do this to make her boyfriend feel bad. She states that she regrets her decision and continues to deny suicidal ideation on multiple occasions. Patient states, "I have a daughter and too much to live for". Patient states that she just wants to go home, back to [place]. She states that this is originally where she is from but she moved to [place] to be with her boyfriend. She denies any homicidal thoughts. Patient reports that she was followed by a therapist who she sees every other week.  The history is provided by the patient. No language interpreter was used.    Past Medical History  Diagnosis Date  . No pertinent past medical history   . HPV in female   . Chlamydia   . Gonorrhea   . Abnormal Pap smear   . Anxiety   . Cancer ([place])     pre cancerous cervical cells  . Anxiety    Past Surgical History  Procedure Laterality Date  . No past surgeries     Family History  Problem Relation Age of Onset  . Alcohol abuse Mother   . Depression Mother   . Drug abuse Mother   . Heart disease Mother     mother had a  closed valve and had heart surgery  . Alcohol abuse Father   . Drug abuse Father    Social History  Substance Use Topics  . Smoking status: Former Smoker -- 0.00 packs/day    Types: Cigarettes  . Smokeless tobacco: Never Used  . Alcohol Use: Yes     Comment: denies 09/21/2015   OB History    Gravida Para Term Preterm AB TAB SAB Ectopic Multiple Living   3 1 1  2  2   1       Review of Systems  Psychiatric/Behavioral: Positive for behavioral problems and agitation. Negative for suicidal ideas.  All other systems reviewed and are negative.   Allergies  Lorazepam  Home Medications   Prior to Admission medications   Medication Sig Start Date End Date Taking? Authorizing Provider  clonazePAM (KLONOPIN) 1 MG tablet Take 1 mg by mouth 2 (two) times daily.   Yes Historical Provider, MD   BP 113/78 mmHg  Pulse 87  Temp(Src) 97.3 F (36.3 C) (Oral)  Resp 14  SpO2 100%  LMP 09/18/2015 (Exact Date)   Physical Exam  Constitutional: She is oriented to person, place, and time. She appears well-developed and well-nourished. No distress.  Nontoxic/nonseptic appearing  HENT:  Head: Normocephalic and atraumatic.  Eyes: Conjunctivae and EOM are normal. No scleral icterus.  Neck: Normal range of motion.  Pulmonary/Chest:  Effort normal. No respiratory distress.  Musculoskeletal: Normal range of motion.  Neurological: She is alert and oriented to person, place, and time. She exhibits normal muscle tone. Coordination normal.  GCS 15. Speech is goal oriented.  Skin: Skin is warm and dry. No rash noted. She is not diaphoretic. No erythema. No pallor.  Psychiatric: Her behavior is normal. Her mood appears anxious. Her speech is rapid and/or pressured. She expresses no homicidal and no suicidal ideation.  Nursing note and vitals reviewed.   ED Course  Procedures (including critical care time) Labs Review Labs Reviewed  COMPREHENSIVE METABOLIC PANEL - Abnormal; Notable for the  following:    Glucose, Bld 103 (*)    ALT 12 (*)    Alkaline Phosphatase 29 (*)    All other components within normal limits  ACETAMINOPHEN LEVEL - Abnormal; Notable for the following:    Acetaminophen (Tylenol), Serum <10 (*)    All other components within normal limits  ETHANOL  SALICYLATE LEVEL  CBC  URINE RAPID DRUG SCREEN, HOSP PERFORMED  HCG, QUANTITATIVE, PREGNANCY    Imaging Review No results found.   I have personally reviewed and evaluated these images and lab results as part of my medical decision-making.   EKG Interpretation None      MDM   Final diagnoses:  Involuntary commitment  Anxiety    Patient brought in under IVC after verbal altercation with boyfriend which escalated to the patient asking the police to shoot her. Patient reports that she only did this to make her boyfriend concerned as he cheated on her this evening and she was upset with him. She reports not taking her Klonopin today. She is calm in triage and exhibits good conceptual ability and logical thinking. She denies SI/HI on multiple occassions. Patient has a hx of compliance with her medications. She regularly follows up with her therapist on an outpatient basis. The patient does not appear to be a harm to herself or others. Patient examined, also, by Dr. Kathrynn Humble who agrees with rescinding IVC at this time. Will d/c back to outpatient providers. Return precautions given at discharge.   Filed Vitals:   09/21/15 0440  BP: 113/78  Pulse: 87  Temp: 97.3 F (36.3 C)  TempSrc: Oral  Resp: 14  SpO2: 100%     Antonietta Breach, PA-C 09/21/15 [id number]  Varney Biles, MD 09/22/15 [phone]

## 2015-09-21 NOTE — ED Notes (Signed)
Patient was given apple juice, explained the delay in discharge for rescinding IVC papers. Patient is understanding. Patient also was given the phone numbers for two cab companies per her request.

## 2015-09-21 NOTE — ED Notes (Signed)
Patient requested her discharge instructions be thrown away. "I don't need them, I know what to do." Patient discharge instructions were placed in the shred bin along with patient armband per patient request.

## 2015-09-21 NOTE — ED Notes (Signed)
PA at bedside.

## 2015-09-21 NOTE — Discharge Instructions (Signed)
Generalized Anxiety Disorder Generalized anxiety disorder (GAD) is a mental disorder. It interferes with life functions, including relationships, work, and school. GAD is different from normal anxiety, which everyone experiences at some point in their lives in response to specific life events and activities. Normal anxiety actually helps us prepare for and get through these life events and activities. Normal anxiety goes away after the event or activity is over.  GAD causes anxiety that is not necessarily related to specific events or activities. It also causes excess anxiety in proportion to specific events or activities. The anxiety associated with GAD is also difficult to control. GAD can vary from mild to severe. People with severe GAD can have intense waves of anxiety with physical symptoms (panic attacks).  SYMPTOMS The anxiety and worry associated with GAD are difficult to control. This anxiety and worry are related to many life events and activities and also occur more days than not for 6 months or longer. People with GAD also have three or more of the following symptoms (one or more in children):  Restlessness.   Fatigue.  Difficulty concentrating.   Irritability.  Muscle tension.  Difficulty sleeping or unsatisfying sleep. DIAGNOSIS GAD is diagnosed through an assessment by your health care provider. Your health care provider will ask you questions aboutyour mood,physical symptoms, and events in your life. Your health care provider may ask you about your medical history and use of alcohol or drugs, including prescription medicines. Your health care provider may also do a physical exam and blood tests. Certain medical conditions and the use of certain substances can cause symptoms similar to those associated with GAD. Your health care provider may refer you to a mental health specialist for further evaluation. TREATMENT The following therapies are usually used to treat GAD:    Medication. Antidepressant medication usually is prescribed for long-term daily control. Antianxiety medicines may be added in severe cases, especially when panic attacks occur.   Talk therapy (psychotherapy). Certain types of talk therapy can be helpful in treating GAD by providing support, education, and guidance. A form of talk therapy called cognitive behavioral therapy can teach you healthy ways to think about and react to daily life events and activities.  Stress managementtechniques. These include yoga, meditation, and exercise and can be very helpful when they are practiced regularly. A mental health specialist can help determine which treatment is best for you. Some people see improvement with one therapy. However, other people require a combination of therapies.   This information is not intended to replace advice given to you by your health care provider. Make sure you discuss any questions you have with your health care provider.   Document Released: 02/06/2013 Document Revised: 11/02/2014 Document Reviewed: 02/06/2013 Elsevier Interactive Patient Education 2016 [employer]vier Inc.  

## 2015-09-21 NOTE — ED Notes (Signed)
Patient was offered a ride back to her car with [employer]. Patient declines, states she wants to catch a cab from the lobby at discharge.

## 2015-09-21 NOTE — ED Notes (Signed)
MD at bedside. 

## 2019-03-30 ENCOUNTER — Encounter (HOSPITAL_COMMUNITY): Payer: Self-pay

## 2019-03-30 ENCOUNTER — Other Ambulatory Visit: Payer: Self-pay

## 2019-03-30 ENCOUNTER — Emergency Department (HOSPITAL_COMMUNITY)
Admission: EM | Admit: 2019-03-30 | Discharge: 2019-03-30 | Disposition: A | Discharge status: Home / Self Care | Payer: Self-pay | Attending: Emergency Medicine | Admitting: Emergency Medicine

## 2019-03-30 DIAGNOSIS — Z87891 Personal history of nicotine dependence: Secondary | ICD-10-CM | POA: Insufficient documentation

## 2019-03-30 DIAGNOSIS — N3001 Acute cystitis with hematuria: Secondary | ICD-10-CM | POA: Insufficient documentation

## 2019-03-30 DIAGNOSIS — Z79899 Other long term (current) drug therapy: Secondary | ICD-10-CM | POA: Insufficient documentation

## 2019-03-30 LAB — URINALYSIS, ROUTINE W REFLEX MICROSCOPIC
Bilirubin Urine: NEGATIVE
Glucose, UA: NEGATIVE mg/dL
Ketones, ur: 5 mg/dL — AB
Nitrite: NEGATIVE
Protein, ur: 100 mg/dL — AB
RBC / HPF: >50 RBC/hpf — ABNORMAL HIGH (ref 0–5)
Specific Gravity, Urine: 1.018 (ref 1.005–1.030)
WBC, UA: >50 WBC/hpf — ABNORMAL HIGH (ref 0–5)
pH: 6 (ref 5.0–8.0)

## 2019-03-30 MED ORDER — IBUPROFEN 600 MG PO TABS: 600.0000 mg | ORAL_TABLET | Freq: Four times a day (QID) | ORAL | 0 refills | Status: AC | PRN

## 2019-03-30 MED ORDER — CEPHALEXIN 500 MG PO CAPS: 500.0000 mg | ORAL_CAPSULE | Freq: Three times a day (TID) | ORAL | 0 refills | Status: AC

## 2019-03-30 MED ORDER — CEPHALEXIN 500 MG PO CAPS
500.0000 mg | ORAL_CAPSULE | Freq: Once | ORAL | Status: AC
  Administered 2019-03-30: 500 mg via ORAL
  Filled 2019-03-30: qty 1

## 2019-03-30 MED ORDER — IBUPROFEN 200 MG PO TABS
600.0000 mg | ORAL_TABLET | Freq: Once | ORAL | Status: AC
  Administered 2019-03-30: 600 mg via ORAL
  Filled 2019-03-30: qty 3

## 2019-03-30 NOTE — ED Notes (Signed)
Called to pt's room, pt sts "So what's wrong with me, give me my medicine so I can go, my ride won't wait that long". Explained to pt we are still waiting for the lab results, she proceeded to nod her head in different directions.

## 2019-03-30 NOTE — ED Provider Notes (Signed)
[facility] DEPT Provider Note  CSN: [id number] Arrival date & time: 03/30/19 0055  Chief Complaint(s) Hematuria  HPI Brandy Kidd is a 28 y.o. female   The history is provided by the patient.  Dysuria  Quality: pressure. Pain severity:  Moderate Onset quality:  Gradual Duration:  2 days Timing:  Intermittent Progression:  Waxing and waning Chronicity:  New Recent urinary tract infections: yes   Relieved by:  None tried Exacerbated by: voiding. Ineffective treatments:  None tried Urinary symptoms: frequent urination and hematuria   Associated symptoms: no abdominal pain, no fever, no flank pain, no genital lesions, no nausea, no vaginal discharge and no vomiting   Risk factors: hx of pyelonephritis and sexually active   Risk factors: not pregnant, no recurrent urinary tract infections and no sexually transmitted infections     Past Medical History Past Medical History:  Diagnosis Date  . Abnormal Pap smear   . Anxiety   . Anxiety   . Cancer ([place])    pre cancerous cervical cells  . Chlamydia   . Gonorrhea   . HPV in female   . No pertinent past medical history    Patient Active Problem List   Diagnosis Date Noted  . Chest pain 03/24/2013  . Pyelonephritis 03/21/2013  . Tobacco abuse 03/21/2013  . MRSA cellulitis 09/10/2011  . STD (sexually transmitted disease) complicating pregnancy, antepartum 08/07/2011  . Trauma during pregnancy 07/22/2011  . Proteinuria 07/08/2011  . Normal pregnancy 05/06/2011  . Other antepartum maternal venereal disease 05/06/2011   Home Medication(s) Prior to Admission medications   Medication Sig Start Date End Date Taking? Authorizing Provider  cephALEXin (KEFLEX) 500 MG capsule Take 1 capsule (500 mg total) by mouth 3 (three) times daily for 5 days. 03/30/19 04/04/19  Fatima Blank, MD  clonazePAM (KLONOPIN) 1 MG tablet Take 1 mg by mouth 2 (two) times daily.    [provider]   ibuprofen (ADVIL) 600 MG tablet Take 1 tablet (600 mg total) by mouth every 6 (six) hours as needed. 03/30/19   Fatima Blank, MD                                                                                                                                    Past Surgical History Past Surgical History:  Procedure Laterality Date  . NO PAST SURGERIES     Family History Family History  Problem Relation Age of Onset  . Alcohol abuse Mother   . Depression Mother   . Drug abuse Mother   . Heart disease Mother        mother had a closed valve and had heart surgery  . Alcohol abuse Father   . Drug abuse Father     Social History Social History   Tobacco Use  . Smoking status: Former Smoker    Packs/day: 0.00    Types: Cigarettes  . Smokeless  tobacco: Never Used  Substance Use Topics  . Alcohol use: Yes    Comment: denies 09/21/2015  . Drug use: No   Allergies Lorazepam  Review of Systems Review of Systems  Constitutional: Negative for fever.  Gastrointestinal: Negative for abdominal pain, nausea and vomiting.  Genitourinary: Positive for dysuria. Negative for flank pain and vaginal discharge.   As noted in HPI Physical Exam Vital Signs  I have reviewed the triage vital signs BP 114/66 (BP Location: Left Arm)   Pulse 98   Temp 98.9 F (37.2 C) (Oral)   Resp 16   LMP 02/21/2019   SpO2 99%   Physical Exam Vitals signs reviewed.  Constitutional:      General: She is not in acute distress.    Appearance: She is well-developed. She is not diaphoretic.  HENT:     Head: Normocephalic and atraumatic.     Right Ear: External ear normal.     Left Ear: External ear normal.     Nose: Nose normal.  Eyes:     General: No scleral icterus.    Conjunctiva/sclera: Conjunctivae normal.  Neck:     Musculoskeletal: Normal range of motion.     Trachea: Phonation normal.  Cardiovascular:     Rate and Rhythm: Normal rate and regular rhythm.  Pulmonary:     Effort:  Pulmonary effort is normal. No respiratory distress.     Breath sounds: No stridor.  Abdominal:     General: There is no distension.     Tenderness: There is no abdominal tenderness.  Musculoskeletal: Normal range of motion.  Neurological:     Mental Status: She is alert and oriented to person, place, and time.  Psychiatric:        Behavior: Behavior normal.     ED Results and Treatments Labs (all labs ordered are listed, but only abnormal results are displayed) Labs Reviewed  URINALYSIS, ROUTINE W REFLEX MICROSCOPIC - Abnormal; Notable for the following components:      Result Value   APPearance CLOUDY (*)    Hgb urine dipstick LARGE (*)    Ketones, ur 5 (*)    Protein, ur 100 (*)    Leukocytes,Ua LARGE (*)    RBC / HPF >50 (*)    WBC, UA >50 (*)    Bacteria, UA RARE (*)    Non Squamous Epithelial 0-5 (*)    All other components within normal limits                                                                                                                         EKG  EKG Interpretation  Date/Time:    Ventricular Rate:    PR Interval:    QRS Duration:   QT Interval:    QTC Calculation:   R Axis:     Text Interpretation:        Radiology No results found. Pertinent labs & imaging results that were available during my care of  the patient were reviewed by me and considered in my medical decision making (see chart for details).  Medications Ordered in ED Medications  ibuprofen (ADVIL) tablet 600 mg (600 mg Oral Given 03/30/19 0226)  cephALEXin (KEFLEX) capsule 500 mg (500 mg Oral Given 03/30/19 0227)                                                                                                                                    Procedures Procedures  (including critical care time)  Medical Decision Making / ED Course I have reviewed the nursing notes for this encounter and the patient's prior records (if available in EHR or on provided paperwork).    Abd  benign. Denies vaginal bleeding or discharge. UA suspicious for UTI. Will treat with Keflex. Patient does not want to wait for UPT. Given lack of abd pain or tenderness, I have a low suspicion for ectopic pregnancy.   The patient is safe for discharge with strict return precautions.   Final Clinical Impression(s) / ED Diagnoses Final diagnoses:  Acute cystitis with hematuria    Disposition: Discharge  Condition: Good  I have discussed the results, Dx and Tx plan with the patient who expressed understanding and agree(s) with the plan. Discharge instructions discussed at great length. The patient was given strict return precautions who verbalized understanding of the instructions. No further questions at time of discharge.    ED Discharge Orders         Ordered    cephALEXin (KEFLEX) 500 MG capsule  3 times daily     03/30/19 0207    ibuprofen (ADVIL) 600 MG tablet  Every 6 hours PRN     03/30/19 0207           Follow Up: [facility] [place] [phone]       This chart was dictated using voice recognition software.  Despite best efforts to proofread,  errors can occur which can change the documentation meaning.   Fatima Blank, MD 03/30/19 [phone]

## 2019-03-30 NOTE — ED Triage Notes (Signed)
Pt complains of lower abdominal pain, urinary frequency and hematuria for two days

## 2019-03-30 NOTE — ED Notes (Signed)
Pt refused to have her armband scanned, refused vital signs, she is cussing and yelling at this nurse because ibuprofen 600 mg is not 1 tblt instead of 3.  Sts "I told him I can't swallow pills, you are trying to kill me, fuck you all". Pt then proceeded to throw empty water cup on the floor and rushed out of the room, trying to slammed the door.

## 2019-04-24 ENCOUNTER — Telehealth: Payer: Self-pay | Admitting: Family Medicine

## 2019-04-24 NOTE — Telephone Encounter (Signed)
Patient called requesting an appointment for a pap smear. She was given the number to the [facility].
